# Patient Record
Sex: Female | Born: 1960 | Race: White | Hispanic: No | Marital: Married | Smoking: Never smoker
Health system: Southern US, Community
[De-identification: ages and names within clinical notes are randomized; demographics above are authoritative.]

## PROBLEM LIST (undated history)

## (undated) DIAGNOSIS — Z7901 Long term (current) use of anticoagulants: Secondary | ICD-10-CM

## (undated) DIAGNOSIS — C4491 Basal cell carcinoma of skin, unspecified: Secondary | ICD-10-CM

## (undated) DIAGNOSIS — F419 Anxiety disorder, unspecified: Secondary | ICD-10-CM

## (undated) DIAGNOSIS — Q742 Other congenital malformations of lower limb(s), including pelvic girdle: Secondary | ICD-10-CM

## (undated) DIAGNOSIS — R635 Abnormal weight gain: Secondary | ICD-10-CM

## (undated) DIAGNOSIS — R599 Enlarged lymph nodes, unspecified: Secondary | ICD-10-CM

## (undated) DIAGNOSIS — R0602 Shortness of breath: Secondary | ICD-10-CM

## (undated) DIAGNOSIS — I8390 Asymptomatic varicose veins of unspecified lower extremity: Secondary | ICD-10-CM

## (undated) DIAGNOSIS — K589 Irritable bowel syndrome without diarrhea: Secondary | ICD-10-CM

## (undated) DIAGNOSIS — G629 Polyneuropathy, unspecified: Secondary | ICD-10-CM

## (undated) DIAGNOSIS — Z5181 Encounter for therapeutic drug level monitoring: Secondary | ICD-10-CM

## (undated) DIAGNOSIS — Z8711 Personal history of peptic ulcer disease: Secondary | ICD-10-CM

## (undated) DIAGNOSIS — E669 Obesity, unspecified: Secondary | ICD-10-CM

## (undated) DIAGNOSIS — R5383 Other fatigue: Secondary | ICD-10-CM

## (undated) DIAGNOSIS — D369 Benign neoplasm, unspecified site: Secondary | ICD-10-CM

## (undated) DIAGNOSIS — R1900 Intra-abdominal and pelvic swelling, mass and lump, unspecified site: Secondary | ICD-10-CM

## (undated) DIAGNOSIS — I8000 Phlebitis and thrombophlebitis of superficial vessels of unspecified lower extremity: Secondary | ICD-10-CM

## (undated) DIAGNOSIS — H3320 Serous retinal detachment, unspecified eye: Secondary | ICD-10-CM

## (undated) DIAGNOSIS — J069 Acute upper respiratory infection, unspecified: Secondary | ICD-10-CM

## (undated) DIAGNOSIS — I82409 Acute embolism and thrombosis of unspecified deep veins of unspecified lower extremity: Secondary | ICD-10-CM

## (undated) DIAGNOSIS — S86011A Strain of right Achilles tendon, initial encounter: Secondary | ICD-10-CM

## (undated) DIAGNOSIS — IMO0002 Reserved for concepts with insufficient information to code with codable children: Secondary | ICD-10-CM

## (undated) DIAGNOSIS — R7303 Prediabetes: Secondary | ICD-10-CM

## (undated) DIAGNOSIS — Z6841 Body Mass Index (BMI) 40.0 and over, adult: Secondary | ICD-10-CM

## (undated) HISTORY — DX: Anxiety disorder, unspecified: F41.9

## (undated) HISTORY — DX: Benign neoplasm, unspecified site: D36.9

## (undated) HISTORY — DX: Encounter for therapeutic drug level monitoring: Z51.81

## (undated) HISTORY — DX: Abnormal weight gain: R63.5

## (undated) HISTORY — DX: Serous retinal detachment, unspecified eye: H33.20

## (undated) HISTORY — DX: Body Mass Index (BMI) 40.0 and over, adult: Z684

## (undated) HISTORY — PX: PRE-MALIGNANT / BENIGN SKIN LESION EXCISION: SHX160

## (undated) HISTORY — DX: Enlarged lymph nodes, unspecified: R59.9

## (undated) HISTORY — DX: Reserved for concepts with insufficient information to code with codable children: IMO0002

## (undated) HISTORY — DX: Basal cell carcinoma of skin, unspecified: C44.91

## (undated) HISTORY — DX: Acute upper respiratory infection, unspecified: J06.9

## (undated) HISTORY — DX: Obesity, unspecified: E66.9

## (undated) HISTORY — DX: Acute embolism and thrombosis of unspecified deep veins of unspecified lower extremity: I82.409

## (undated) HISTORY — DX: Morbid (severe) obesity due to excess calories: E66.01

## (undated) HISTORY — DX: Strain of right Achilles tendon, initial encounter: S86.011A

## (undated) HISTORY — DX: Shortness of breath: R06.02

## (undated) HISTORY — DX: Irritable bowel syndrome, unspecified: K58.9

## (undated) HISTORY — DX: Other congenital malformations of lower limb(s), including pelvic girdle: Q74.2

## (undated) HISTORY — DX: Long term (current) use of anticoagulants: Z79.01

## (undated) HISTORY — DX: Intra-abdominal and pelvic swelling, mass and lump, unspecified site: R19.00

## (undated) HISTORY — DX: Other fatigue: R53.83

## (undated) HISTORY — DX: Polyneuropathy, unspecified: G62.9

## (undated) HISTORY — DX: Phlebitis and thrombophlebitis of superficial vessels of unspecified lower extremity: I80.00

## (undated) HISTORY — DX: Asymptomatic varicose veins of unspecified lower extremity: I83.90

## (undated) HISTORY — DX: Personal history of peptic ulcer disease: Z87.11

## (undated) HISTORY — DX: Prediabetes: R73.03

## (undated) HISTORY — PX: VARICOSE VEIN SURGERY: SHX832

## (undated) HISTORY — PX: OTHER SURGICAL HISTORY: SHX169

---

## 1996-02-27 ENCOUNTER — Encounter: Payer: Self-pay | Admitting: Family Medicine

## 1996-02-27 LAB — CONVERTED CEMR LAB: Pap Smear: NORMAL

## 2000-11-02 ENCOUNTER — Other Ambulatory Visit: Admission: RE | Admit: 2000-11-02 | Discharge: 2000-11-02 | Payer: Self-pay | Admitting: Urology

## 2001-01-27 HISTORY — PX: FLEXIBLE SIGMOIDOSCOPY: SHX1649

## 2004-08-30 ENCOUNTER — Ambulatory Visit: Payer: Self-pay | Admitting: Family Medicine

## 2004-09-03 ENCOUNTER — Ambulatory Visit: Payer: Self-pay | Admitting: Family Medicine

## 2004-09-17 ENCOUNTER — Ambulatory Visit: Payer: Self-pay | Admitting: Family Medicine

## 2004-09-24 ENCOUNTER — Ambulatory Visit: Payer: Self-pay

## 2004-10-27 ENCOUNTER — Ambulatory Visit: Payer: Self-pay

## 2004-11-27 ENCOUNTER — Ambulatory Visit: Payer: Self-pay

## 2004-12-27 ENCOUNTER — Ambulatory Visit: Payer: Self-pay

## 2005-02-18 ENCOUNTER — Ambulatory Visit: Payer: Self-pay | Admitting: Family Medicine

## 2005-08-14 ENCOUNTER — Ambulatory Visit: Payer: Self-pay | Admitting: Family Medicine

## 2005-08-28 ENCOUNTER — Ambulatory Visit: Payer: Self-pay | Admitting: Family Medicine

## 2005-12-04 ENCOUNTER — Ambulatory Visit: Payer: Self-pay | Admitting: Family Medicine

## 2006-11-06 ENCOUNTER — Telehealth (INDEPENDENT_AMBULATORY_CARE_PROVIDER_SITE_OTHER): Payer: Self-pay | Admitting: Internal Medicine

## 2006-11-09 ENCOUNTER — Ambulatory Visit: Payer: Self-pay | Admitting: Family Medicine

## 2006-11-30 ENCOUNTER — Ambulatory Visit: Payer: Self-pay | Admitting: Family Medicine

## 2007-05-25 ENCOUNTER — Ambulatory Visit: Payer: Self-pay | Admitting: Family Medicine

## 2007-06-11 ENCOUNTER — Ambulatory Visit: Payer: Self-pay | Admitting: Family Medicine

## 2007-06-11 DIAGNOSIS — R635 Abnormal weight gain: Secondary | ICD-10-CM | POA: Insufficient documentation

## 2007-07-19 ENCOUNTER — Ambulatory Visit: Payer: Self-pay | Admitting: Family Medicine

## 2007-07-21 ENCOUNTER — Encounter: Payer: Self-pay | Admitting: Family Medicine

## 2007-07-21 DIAGNOSIS — K589 Irritable bowel syndrome without diarrhea: Secondary | ICD-10-CM | POA: Insufficient documentation

## 2007-07-21 DIAGNOSIS — I8390 Asymptomatic varicose veins of unspecified lower extremity: Secondary | ICD-10-CM | POA: Insufficient documentation

## 2007-07-21 DIAGNOSIS — I8000 Phlebitis and thrombophlebitis of superficial vessels of unspecified lower extremity: Secondary | ICD-10-CM | POA: Insufficient documentation

## 2007-10-07 ENCOUNTER — Ambulatory Visit: Payer: Self-pay | Admitting: Cardiology

## 2007-11-01 ENCOUNTER — Telehealth: Payer: Self-pay | Admitting: Family Medicine

## 2007-11-25 DIAGNOSIS — R1900 Intra-abdominal and pelvic swelling, mass and lump, unspecified site: Secondary | ICD-10-CM | POA: Insufficient documentation

## 2007-11-26 ENCOUNTER — Telehealth: Payer: Self-pay | Admitting: Family Medicine

## 2007-12-16 ENCOUNTER — Ambulatory Visit: Payer: Self-pay | Admitting: Family Medicine

## 2007-12-16 DIAGNOSIS — J069 Acute upper respiratory infection, unspecified: Secondary | ICD-10-CM | POA: Insufficient documentation

## 2007-12-16 DIAGNOSIS — R599 Enlarged lymph nodes, unspecified: Secondary | ICD-10-CM | POA: Insufficient documentation

## 2007-12-16 LAB — CONVERTED CEMR LAB
HDL goal, serum: 40 mg/dL
LDL Goal: 160 mg/dL

## 2007-12-24 ENCOUNTER — Telehealth (INDEPENDENT_AMBULATORY_CARE_PROVIDER_SITE_OTHER): Payer: Self-pay | Admitting: *Deleted

## 2007-12-28 HISTORY — PX: TOTAL ABDOMINAL HYSTERECTOMY W/ BILATERAL SALPINGOOPHORECTOMY: SHX83

## 2008-05-26 ENCOUNTER — Telehealth: Payer: Self-pay | Admitting: Family Medicine

## 2008-06-26 ENCOUNTER — Encounter: Payer: Self-pay | Admitting: Family Medicine

## 2008-06-26 ENCOUNTER — Ambulatory Visit: Payer: Self-pay | Admitting: Family Medicine

## 2008-06-27 ENCOUNTER — Ambulatory Visit: Payer: Self-pay | Admitting: Family Medicine

## 2008-06-27 HISTORY — PX: CATARACT EXTRACTION, BILATERAL: SHX1313

## 2009-03-01 ENCOUNTER — Telehealth: Payer: Self-pay | Admitting: Family Medicine

## 2009-08-27 DIAGNOSIS — S86011A Strain of right Achilles tendon, initial encounter: Secondary | ICD-10-CM

## 2009-08-27 HISTORY — DX: Strain of right Achilles tendon, initial encounter: S86.011A

## 2009-09-03 ENCOUNTER — Encounter (INDEPENDENT_AMBULATORY_CARE_PROVIDER_SITE_OTHER): Payer: Self-pay | Admitting: *Deleted

## 2009-09-24 ENCOUNTER — Encounter: Payer: Self-pay | Admitting: Family Medicine

## 2009-09-27 ENCOUNTER — Emergency Department (HOSPITAL_COMMUNITY): Admission: EM | Admit: 2009-09-27 | Discharge: 2009-09-27 | Payer: Self-pay | Admitting: Emergency Medicine

## 2009-09-27 ENCOUNTER — Encounter (INDEPENDENT_AMBULATORY_CARE_PROVIDER_SITE_OTHER): Payer: Self-pay | Admitting: Orthopedic Surgery

## 2009-09-27 ENCOUNTER — Ambulatory Visit: Payer: Self-pay | Admitting: Vascular Surgery

## 2009-09-28 ENCOUNTER — Telehealth: Payer: Self-pay | Admitting: Family Medicine

## 2009-10-02 ENCOUNTER — Ambulatory Visit: Payer: Self-pay | Admitting: Family Medicine

## 2009-10-02 LAB — CONVERTED CEMR LAB: Prothrombin Time: 16.5 s

## 2009-10-05 ENCOUNTER — Ambulatory Visit: Payer: Self-pay | Admitting: Family Medicine

## 2009-10-05 LAB — CONVERTED CEMR LAB: Prothrombin Time: 23.7 s

## 2009-10-12 ENCOUNTER — Ambulatory Visit: Payer: Self-pay | Admitting: Family Medicine

## 2009-10-26 ENCOUNTER — Ambulatory Visit: Payer: Self-pay | Admitting: Family Medicine

## 2009-10-26 LAB — CONVERTED CEMR LAB: Prothrombin Time: 37.6 s

## 2009-10-30 ENCOUNTER — Encounter: Payer: Self-pay | Admitting: Family Medicine

## 2009-11-09 ENCOUNTER — Telehealth: Payer: Self-pay | Admitting: Family Medicine

## 2009-11-09 ENCOUNTER — Ambulatory Visit: Payer: Self-pay | Admitting: Family Medicine

## 2009-11-09 LAB — CONVERTED CEMR LAB
INR: 3.2
Prothrombin Time: 38.4 s

## 2009-11-12 ENCOUNTER — Telehealth: Payer: Self-pay | Admitting: Family Medicine

## 2009-12-04 ENCOUNTER — Encounter: Payer: Self-pay | Admitting: Family Medicine

## 2009-12-07 ENCOUNTER — Ambulatory Visit: Payer: Self-pay | Admitting: Family Medicine

## 2009-12-07 LAB — CONVERTED CEMR LAB: Prothrombin Time: 38.1 s

## 2009-12-28 ENCOUNTER — Ambulatory Visit: Payer: Self-pay | Admitting: Family Medicine

## 2009-12-28 LAB — CONVERTED CEMR LAB: INR: 3

## 2010-01-23 ENCOUNTER — Ambulatory Visit
Admission: RE | Admit: 2010-01-23 | Discharge: 2010-01-23 | Payer: Self-pay | Source: Home / Self Care | Attending: Family Medicine | Admitting: Family Medicine

## 2010-01-23 DIAGNOSIS — IMO0002 Reserved for concepts with insufficient information to code with codable children: Secondary | ICD-10-CM | POA: Insufficient documentation

## 2010-01-23 LAB — CONVERTED CEMR LAB
INR: 2.8
Prothrombin Time: 34 s

## 2010-02-07 ENCOUNTER — Ambulatory Visit
Admission: RE | Admit: 2010-02-07 | Discharge: 2010-02-07 | Payer: Self-pay | Source: Home / Self Care | Attending: Family Medicine | Admitting: Family Medicine

## 2010-02-26 NOTE — Assessment & Plan Note (Signed)
Summary: FOLLOW UP / LFW   Vital Signs:  Patient profile:   50 year old female Height:      67 inches Weight:      284.75 pounds BMI:     44.76 Temp:     97.7 degrees F oral Pulse rate:   80 / minute Pulse rhythm:   regular BP sitting:   122 / 80  (left arm) Cuff size:   large  Vitals Entered By: Delilah Shan CMA [name]) (December 28, 2009 4:01 PM) CC: 3 months follow up   History of Present Illness: Still with some soreness and stiffness in leg, esp after sitting.  Went through physical therapy and was released by ortho.   Had anticoagulation for 3 months.  Wears compression stockings during flights.  Off ASA since being on coumadin.  No other h/o DVT.  Had seen Dr. Raechel Chute with vein clinic.  No h/o miscarriages.    On premarin for 1.5 years.  Had TAH and BSO.  See plan.  Sees [facility] for HRT.   Mother with h/o DVT.  Father with h/o DVT and PE after injury.      Allergies: 1)  ! 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    Review of Systems       See HPI.  Otherwise negative.    Physical Exam  General:  NAD R leg w/o edema, normal range of motion at ankle, no bands, no cords   Impression & Recommendations:  Problem # 1:  DVT-09/2009 (ICD-453.40) >25 min spent with patient, at least half of which was spent on counseling ZO:XWRU.  I would like her to come off HRT to lower risk of another DVT.  this was her first episode and it was after an injury, but there is a family history.  I would like her to talk with [facility] about taper from HRT.  She agrees.  Continue with coumadin for now, up to 3 months for now for 6 months of anticoagulation.  She could stop sooner if off HRT. She agrees with plan. If second clot, then she would likely need lifelong anticoagulation. She understands.    Complete Medication List: 1)  Premarin 0.625 Mg  Tabs (Estrogens conjugated) .... Take 1 tablet by mouth once a day 2)  Stool Softener 100 Mg Caps (Docusate sodium) .... Take 1 capsule by mouth once a day 3)  Coumadin 5 Mg Tabs (Warfarin sodium) .... Take 1-1/2  tablets  by mouth once a day  Patient Instructions: 1)  Don't change your coumadin and come back for an INR in 4 weeks.  Talk to your ObGyn clinic about coming off the premarin.  Let me know what they have to say.  We should try to get you off the coumadin in the next 3 months.  Don't take aspirin in the meantime.  Take care.    Orders Added: 1)  Est. Patient Level IV [04540]    Current Allergies (reviewed today): ! * TAPE

## 2010-02-26 NOTE — Letter (Signed)
Summary: Unable to Contact Patient/[facility]  Unable to Contact Patient/[facility]   Imported By: Lanelle Bal 12/14/2009 14:07:55  _____________________________________________________________________  External Attachment:    Type:   Image     Comment:   External Document  Appended Document: Unable to Contact Patient/[facility] call patient and leave instructions for them to make appointment.   Appended Document: Unable to Contact Patient/[facility] Left message on voicemail of cell phone to contact the [facility] or [place] if she is still committed to this program.

## 2010-02-26 NOTE — Letter (Signed)
Summary: [facility]  [facility]   Imported By: Lanelle Bal 09/28/2009 14:07:14  _____________________________________________________________________  External Attachment:    Type:   Image     Comment:   External Document  Appended Document: [facility]    Clinical Lists Changes  Observations: Added new observation of PAST MED HX: possible R achilles tear 08/2009 per [facility] ortho (09/28/2009 16:13)       Past History:  Past Medical History: possible R achilles tear 08/2009 per [facility] ortho

## 2010-02-26 NOTE — Assessment & Plan Note (Signed)
Summary: F/U per [facility] Nurse Line - PT/INR Joann Mendez   Vital Signs:  Patient profile:   50 year old female Height:      67 inches Weight:      264.12 pounds BMI:     41.52 Temp:     98.2 degrees F oral Pulse rate:   76 / minute Pulse rhythm:   regular BP sitting:   110 / 80  (left arm) Cuff size:   large  Vitals Entered By: Delilah Shan CMA Rupinder Livingston Dull) (October 02, 2009 9:11 AM) CC: F/U per Dr. Para March - Nurse Line  -  Will get PT/INR after the visit., Back Pain   History of Present Illness: Possible achilles tear in [place] last week in August.  Saw [facility] ortho and in [facility] walker/boot.  Called back on Thursday bc patient was having calf pain.  H/o DVT in the familiy.  Pt seen at ER and DVT seen.  Started on coumadin since 09/27/09.  Leg feels better with less pain.  Has follow up with ortho re: MRI today.  Due for INR check.  Records reviewed.  Not SOB.  No CP.   This is first DVT for patient.  On lovenox.    Allergies: 1)  ! * Tape  Past History:  Family History: Last updated: 10/02/2009 Father: ALIVE AT 38 YOA +HTN Mother: ALIVE 67 YOA ? ATRIAL FIB, h/o DVT Siblings: 1 SISTER ALIVE AT 30 YOA CV: NEGATIVE HBP: + FATHER DM: NEGATIVE GOUT/ARTHRITIS: PROSTATE CANCER:  BREAST/OVARIAN/UTERINE CANCER: NEGATIVE COLON CANCER: +STOMACH GM DEPRESSION: NEGATIVE ETOH/DRUG ABUSE: NEGATIVE OTHER: NEGATIVE STROKE  Social History: Last updated: 07/21/2007 non smoker Marital Status: Married REMARRIED SUMMER OF 1997 Children: 2 DAUGHTERS Occupation: [employer], [employer]. COMPLI[employer] OFFICE  Past Medical History: possible R achilles tear 08/2009 per [facility] ortho R leg DVT 09/27/09  Family History: Father: ALIVE AT 83 YOA +HTN Mother: ALIVE 70 YOA ? ATRIAL FIB, h/o DVT Siblings: 1 SISTER ALIVE AT 30 YOA CV: NEGATIVE HBP: + FATHER DM: NEGATIVE GOUT/ARTHRITIS: PROSTATE CANCER:  BREAST/OVARIAN/UTERINE CANCER: NEGATIVE COLON CANCER: +STOMACH GM DEPRESSION: NEGATIVE ETOH/DRUG ABUSE: NEGATIVE OTHER:  NEGATIVE STROKE  Review of Systems       See HPI.  Otherwise negative.    Physical Exam  General:  GEN: nad, alert and oriented HEENT: mucous membranes moist CV: rrr.  PULM: ctab, no inc wob ABD: soft, +bs EXT: no edema, R foot in CAM walker, minimally tender to palpation on calf and at achilles- improved per patient.  not tested with weightbearing.  distally NV intact.  SKIN: no acute rash but bruising noted on abdominal wall at injection sites.    Impression & Recommendations:  Problem # 1:  DVT-09/2009 (ICD-453.40) DVT d/w patient.  Cont lovenox for now and increase coumadin in meantime.  Fu Friday for INR.  If INR<2, will need lovenox through the weekend.  Has follow up with ortho. Pt should be on coumadin for at least 3 months.  We can talk about options (3vs 6 months in 12/11).  If any SOB/CP, to ER.  She understands.  Routine coumadin instructions given.  Orders: Protime (55732KG)  Complete Medication List: 1)  Premarin 0.625 Mg Tabs (Estrogens conjugated) .... Take 1 tablet by mouth once a day 2)  Stool Softener 100 Mg Caps (Docusate sodium) .... Take 1 capsule by mouth once a day 3)  Coumadin 5 Mg Tabs (Warfarin sodium) .... Take 1 tablet by mouth once a day 4)  Lovenox 100 Mg/ml Soln (Enoxaparin sodium) .Marland KitchenMarland KitchenMarland Kitchen  1ml injected [place] two times a day  Patient Instructions: 1)  Return for your INR check Friday. 2)  I want you to come back for a visit with me in 3 months to talk about coming off the coumadin.  3)  Coumadin dose: Take 1.5 tabs on Tuesday and Wednesday and 1 tab on Thursday.  Prescriptions: LOVENOX 100 MG/ML SOLN (ENOXAPARIN SODIUM) 1ml injected [place] two times a day  #10 syringes x 0   Entered and Authorized by:   Crawford Givens MD   Signed by:   Crawford Givens MD on 10/02/2009   Method used:   [facility]        [facility]  [place]       [address]       [place], [place]  [address]       Ph: [phone] or [phone]       Fax:  [phone]   RxID:   [id number]   Current Allergies (reviewed today): ! * TAPE

## 2010-02-26 NOTE — Progress Notes (Signed)
Summary: wants [facility] to [facility]  Phone Note Call from Patient Call back at Work Phone [phone]   Caller: Patient Call For: Dr. Para March Summary of Call: Pt would like referral to [facility] at [facility].  She wants to lose some weight, but cant exercise there without a referral. Initial call taken by: Lowella Petties CMA,  November 12, 2009 11:48 AM  Follow-up for Phone Call        referral ordered.  Follow-up by: Crawford Givens MD,  November 12, 2009 1:32 PM

## 2010-02-26 NOTE — Letter (Signed)
Summary: Nadara Eaton letter  [place] [facility]  [address] [place], [place] [address]   Phone: [phone]  Fax: [phone]       09/03/2009 MRN: [id number]  Margeret Hoxworth [address] [place], [place]  [address]  Dear Ms. Dacanay,  [facility] [place] and [place] announce the retirement of Arta Silence, M.D., from full-time practice at the [facility] office effective July 26, 2009 and his plans of returning part-time.  It is important to Dr. Hetty Ely and to our practice that you understand that [facility] has seven physicians in our office for your health care needs.  We will continue to offer the same exceptional care that you have today.    Dr. Hetty Ely has spoken to many of you about his plans for retirement and returning part-time in the fall.   We will continue to work with you through the transition to schedule appointments for you in the office and meet the high standards that [facility] is committed to.   Again, it is with great pleasure that we share the news that Dr. Hetty Ely will return to [facility] in October of 2011 with a reduced schedule.    If you have any questions, or would like to request an appointment with one of our physicians, please call us at [phone] and press the option for Scheduling an appointment.  We take pleasure in providing you with excellent patient care and look forward to seeing you at your next office visit.  Our [facility] Physicians are:  Tillman Abide, M.D. Laurita Quint, M.D. Roxy Manns, M.D. Kerby Nora, M.D. Hannah Beat, M.D. Ruthe Mannan, M.D. We proudly welcomed Raechel Ache, M.D. and Eustaquio Boyden, M.D. to the practice in July/August 2011.  Sincerely,  [facility]

## 2010-02-26 NOTE — Progress Notes (Signed)
  Phone Note Call from Patient   Caller: Patient Call For: duncan Summary of Call: What should patient do when she travels because of DVT, Prior to this DVT she took ASA 1 week prior to traveling but she wasn't on Coumadin at that time. She also wears compression stockings when traveling.Please advise. Initial call taken by: Mills Koller,  November 09, 2009 12:28 PM  Follow-up for Phone Call        I wouldn't add on aspirin now.  Continue coumadin as directed and wear compression stockings if this is more comfortable for patient.  Follow-up by: Crawford Givens MD,  November 09, 2009 1:40 PM  Additional Follow-up for Phone Call Additional follow up Details #1::        Left message on home machine to return my call. Kim Dance CMA [name])  November 09, 2009 2:27 PM   Patient notified as instructed by telephone. Pt had additional question for Dr. Para March. Is it OK to travel by airlines on long distance trips if she wears compression hose? pt said a call back on Mon at work # [phone] would be appreciated.Lewanda Rife LPN  November 09, 2009 5:15 PM      Appended Document:  It is okay to travel with the compression hose.  Okay to call back Monday.   Appended Document:  Left message on machine for patient to call back.    Appended Document:  Left message on voicemail  to return call.   Appended Document:  Left message on voicemail  in detail.  Personalized VM on cell phone number.

## 2010-02-26 NOTE — Letter (Signed)
Summary: [facility]   Imported By: Sherian Rein 11/08/2009 08:22:29  _____________________________________________________________________  External Attachment:    Type:   Image     Comment:   External Document

## 2010-02-26 NOTE — Letter (Signed)
Summary: [facility]   Imported By: Sherian Rein 10/10/2009 08:05:40  _____________________________________________________________________  External Attachment:    Type:   Image     Comment:   External Document  Appended Document: [facility]    Clinical Lists Changes  Observations: Added new observation of PAST MED HX: R achilles strain 08/2009 per [facility] ortho R leg DVT 09/27/09 (10/10/2009 11:54)       Past History:  Past Medical History: R achilles strain 08/2009 per [facility] ortho R leg DVT 09/27/09

## 2010-02-26 NOTE — Progress Notes (Signed)
Summary: tamiflu  Phone Note Call from Patient Call back at Home Phone [phone]   Caller: Patient Call For: Shaune Leeks MD Summary of Call: Patient wants to know if she can get a rx for tamiflu called [facility] [place] because she is taking care of husband and daughter who both have the flu and she is afraid that she will get it.  Initial call taken by: Melody Comas,  March 01, 2009 10:47 AM    Prescriptions: TAMIFLU 75 MG CAPS (OSELTAMIVIR PHOSPHATE) Take 1 tablet by mouth once a day x 10 days  #10 x 0   Entered and Authorized by:   Shaune Leeks MD   Signed by:   Shaune Leeks MD on 03/01/2009   Method used:   [facility]ically [facility]        [facility]  [place] (retail)       [address]       [place], [place]  [address]       Ph: [phone] or [phone]       Fax: [phone]   RxID:   [id number]

## 2010-02-26 NOTE — Progress Notes (Signed)
Summary: fax from call a nurse   Phone Note Call from Patient Call back at [phone]   Caller: Fax from call a nurse- [place] Call For: Dr. Para March  Summary of Call: Message from call a nurse- new dvt, started on Lovenox and coumadin and will need to recheck on Tuesday, 9-6, Dr. Anitra Lauth is the dr. at [place].  Initial call taken by: Melody Comas,  September 28, 2009 8:51 AM  Follow-up for Phone Call        call patient and make sure she has OV on Tuesday.  We can do INR at that time.  Follow-up by: Crawford Givens MD,  September 28, 2009 9:41 AM  Additional Follow-up for Phone Call Additional follow up Details #1::        She only has a lab appt on Tuesday, 10/02/09.  I will phone her to make an OV also.  Delilah Shan CMA [name])  September 28, 2009 11:54 AM   New Problems: DVT-09/2009 (ICD-453.40)   New Problems: DVT-09/2009 (ICD-453.40)

## 2010-02-28 NOTE — Assessment & Plan Note (Signed)
Summary: ? bronchitis/nt   Vital Signs:  Patient profile:   50 year old female Height:      67 inches Weight:      282 pounds BMI:     44.33 Temp:     97.9 degrees F oral Pulse rate:   84 / minute Pulse rhythm:   regular BP sitting:   134 / 80  (left arm) Cuff size:   large  Vitals Entered By: Delilah Shan CMA [name]) (January 23, 2010 11:50 AM) CC: ? bronchitis.  ? Protime today?   History of Present Illness: Pt here for thinking she has bronchitis. She travels everywhere with her job...she has been off for the last week and got sick.  She went to [facility] for Christmas with her family and woke up with a red eye. She had eyewash from after surgery a while back and used that. It was Prednisolone. It helped. Then she developed cough and ST and congestion. She took part of a Zpak that her family had. She then started feeling better after using the two days of Zithromax, but that was all she had. She has had some wheezing, some productive cough. No fever or chills, no headache but had one two days ago. She has taken some excedrin....she is on Coumadin.  She has had multiple problems in the last year or so to include multiple tendon and muscle tears, DVT hysterectomy and hot flashes.  She also would like me to look at her little toe on the left foot where she frequently gets maceration after blister formation on the underside of the toe which forms white discharge and recurs. She typically opens the blister and cleans it out, then uses betadine and neosporin. But again this tends to recur. She also, after discussing things with her mother, was told she needs to take a small dose of Valium "to take the edge off" in that she is trying to juggle fourteen things at one time. She alson wanted to discuss her husband's situation and their need to get on their own, away from the family horse [place].  Problems Prior to Update: 1)  Encounter For Therapeutic Drug Monitoring  (ICD-V58.83) 2)  Encounter For  Long-term Use of Anticoagulants  (ICD-V58.61) 3)  Dvt-09/2009  (ICD-453.40) 4)  Enlargement of Lymph Nodes  (ICD-785.6) 5)  Uri  (ICD-465.9) 6)  Abdominal Mass  (ICD-789.30) 7)  Varicose Veins, Lower Extremities  (ICD-454.9) 8)  Superficial Thrombophlebitis  (ICD-451.0) 9)  Irritable Bowel Syndrome  (ICD-564.1) 10)  Weight Gain  (ICD-783.1)  Medications Prior to Update: 1)  Premarin 0.625 Mg Tabs (Estrogens Conjugated) .... Take 1 Tablet By Mouth Once A Day 2)  Stool Softener 100 Mg Caps (Docusate Sodium) .... Take 1 Capsule By Mouth Once A Day 3)  Coumadin 5 Mg Tabs (Warfarin Sodium) .... Take 1-1/2  Tablets  By Mouth Once A Day  Allergies: 1)  ! * Tape  Physical Exam  General:  NAD, overweight, minimally congested.  Head:  Normocephalic and atraumatic without obvious abnormalities. No apparent alopecia or balding. Sinuses NT. Eyes:  Conjunctiva clear bilaterally. Palp conjunctiva quiet. Ears:  External ear exam shows no significant lesions or deformities.  Otoscopic examination reveals clear canals, tympanic membranes are intact bilaterally without bulging, retraction, inflammation or discharge. Hearing is grossly normal bilaterally. Nose:  External nasal examination shows no deformity or inflammation. Nasal mucosa are pink and moist without lesions or exudates. L nare mildly narrowed. Mouth:  Oral mucosa and oropharynx without lesions  or exudates.  Teeth in good repair. Neck:  No deformities, masses, or tenderness noted. Lungs:  Normal respiratory effort, chest expands symmetrically. Lungs are clear to auscultation, no crackles or wheezes. Heart:  Normal rate and regular rhythm. S1 and S2 normal without gallop, murmur, click, rub or other extra sounds. Extremities:  R leg w/o edema, normal range of motion at ankle, no bands, no cords L little toe with abrasive area with two inflammatory blisters with white nonpustular discharge in one, no erythema, streaking or swelling. Psych:   Cognition and judgment appear intact. Alert and cooperative with normal attention span and concentration. No apparent delusions, illusions, hallucinations   Impression & Recommendations:  Problem # 1:  BRONCHITIS- ACUTE (ICD-466.0) Assessment New See instructions. Her updated medication list for this problem includes:    Zithromax Z-pak 250 Mg Tabs (Azithromycin) .Marland Kitchen... As dir  Problem # 2:  ENCOUNTER FOR LONG-TERM USE OF ANTICOAGULANTS (ICD-V58.61) Assessment: Unchanged  Orders:Due to exposure to Abs, check PT today. Protime (95621HY)  Problem # 3:  CONJUNCTIVITIS (ICD-372.30) Assessment: New  Presumably viral as is clearing on its own....discussed using hypotears as needed. Throw away old eyedrops.  Discussed treatment, and urged patient to wash hands carefully after touching face.   Problem # 4:  EARLY HAMMER TOE, LITTLE LEFT TOE (ICD-755.66) Assessment: New Discussed attempts at straightening toe to avoid abrasion and resultant blistering.  Problem # 5:  AGITATION (ICD-307.9) Assessment: New Discussed at length. I am not in favor of using "Tranquilizers" to take thge edge off. She really needs toi cut back.  Complete Medication List: 1)  Premarin 0.625 Mg Tabs (Estrogens conjugated) .... Take 1 tablet by mouth once a day 2)  Stool Softener 100 Mg Caps (Docusate sodium) .... Take 1 capsule by mouth once a day 3)  Coumadin 5 Mg Tabs (Warfarin sodium) .... Take 1-1/2  tablets  by mouth once a day 4)  Zithromax Z-pak 250 Mg Tabs (Azithromycin) .... As dir  Patient Instructions: 1)  Take a Z pak to finish. 2)  Take Guaifenesin by [facility] or [facility] and getting MUCOUS RELIEF EXPECTORANT (400mg ), take 11/2 tabs by mouth AM and NOON. 3)  Drink lots of fluids anytime taking Guaifenesin.  4)  Check PT today. 5)  50 mins spent with pt. Prescriptions: ZITHROMAX Z-PAK 250 MG TABS (AZITHROMYCIN) as dir  #1 pak x 0   Entered and Authorized by:   Shaune Leeks MD   Signed by:   Shaune Leeks MD on 01/23/2010   Method used:   [facility]        [facility]  [place]       [address]       [place], [place]  [address]       Ph: [phone] or [phone]       Fax: [phone]   RxID:   [id number]    Orders Added: 1)  Protime [[id number]] 2)  Est. Patient Level IV [84166]    Current Allergies (reviewed today): ! * TAPE  Laboratory Results   Blood Tests   Date/Time Recieved: January 23, 2010 12:35 PM  Date/Time Reported: January 23, 2010 12:35 PM   PT: 34.0 s   (Normal Range: 10.6-13.4)  INR: 2.8   (Normal Range: 0.88-1.12   Therap INR: 2.0-3.5)      ANTICOAGULATION RECORD PREVIOUS REGIMEN & LAB RESULTS Anticoagulation Diagnosis:  Deep venous thrombosis on  10/02/2009 Previous INR Goal Range:  2.0-3.0 on  10/02/2009 Previous INR:  3.0 on  12/28/2009 Previous Coumadin Dose(mg):  7.5mg  daily on  10/12/2009 Previous Regimen:  7.5,g daily, 5mg  tues, fri on  10/12/2009 Previous Coagulation Comments:  . on  10/26/2009  NEW REGIMEN & LAB RESULTS Current INR: 2.8 Current Coumadin Dose(mg): 7.5,g daily, 5mg  tues, fri Regimen: 7.5,g daily, 5mg  tues, fri  (no change)  Provider: schaller      Repeat testing in: next week has appt already MEDICATIONS PREMARIN 0.625 MG TABS (ESTROGENS CONJUGATED) Take 1 tablet by mouth once a day STOOL SOFTENER 100 MG CAPS (DOCUSATE SODIUM) Take 1 capsule by mouth once a day COUMADIN 5 MG TABS (WARFARIN SODIUM) Take 1-1/2  tablets  by mouth once a day ZITHROMAX Z-PAK 250 MG TABS (AZITHROMYCIN) as dir  Dose has been reviewed with patient or caretaker during this visit.  Reviewed by: Allison Quarry  Anticoagulation Visit Questionnaire      Coumadin dose missed/changed:  No      Abnormal Bleeding Symptoms:  No Any diet changes including alcohol intake, vegetables or greens since the last visit:  No Any illnesses or hospitalizations since the last visit:   Yes      Recent Illness/Hospitalizations:  on z pack Any signs of clotting since the last visit (including chest discomfort, dizziness, shortness of breath, arm tingling, slurred speech, swelling or redness in leg):  No

## 2010-03-25 ENCOUNTER — Telehealth: Payer: Self-pay | Admitting: Family Medicine

## 2010-04-01 ENCOUNTER — Encounter: Payer: Self-pay | Admitting: Family Medicine

## 2010-04-04 NOTE — Progress Notes (Signed)
Summary: pt will stop coumadin and premarin tomorrow  Phone Note Call from Patient Call back at Home Phone [phone]   Caller: Patient Call For: Crawford Givens MD Complaint: Urinary/GYN Problems Summary of Call: Pt wanted you to know that tomorrow is her last day on premarin and her last day on coumadin.  She was told to wait a week beforel she started taking a low dose aspirin.  She just wanted to confirm that this is still the plan.  Please call her and let her know if this isn't correct. Initial call taken by: Lowella Petties CMA, AAMA,  March 25, 2010 4:08 PM  Follow-up for Phone Call        I called patient and LMOVM.  I'll try to get in touch with her tomorrow.  Base on prev discussions, coming off the coumadin and starting aspirin would be reasonable.  I will try to discuss with patient.    Quoted from last OV with patient (12/28/09)-I would like her to come off HRT to lower risk of another DVT.  this was her first episode and it was after an injury, but there is a family history.  I would like her to talk with [facility] about taper from HRT.  She agrees.  Continue with coumadin for now, up to 3 months for now for 6 months of anticoagulation.  She could stop sooner if off HRT. She agrees with plan. If second clot, then she would likely need lifelong anticoagulation. She understands.   Follow-up by: Crawford Givens MD,  March 25, 2010 5:17 PM     Appended Document: pt will stop coumadin and premarin tomorrow I called and LVMOMV for patient.   Appended Document: pt will stop coumadin and premarin tomorrow  She just got back in town.  She was in [place] caring for father after he had a CABG/valve replacement.  She was tapered off premarin.  We talked about the coumadin.  At this point, it is reasonable to stop the coumadin.  See the above notes.  I would start 81mg  aspirin a day.  She is aware of potential for another clot off coumadin and the potential for bleeding with  continued coumadin.   Clinical Lists Changes  Observations: Added new observation of FAMILY HX: Father: ALIVE +HTN, CAD s/p CABG and valve replacement after gallstone pancreatitis Mother: ALIVE ? ATRIAL FIB, h/o DVT Siblings: 1 SISTER ALIVE AT 30 YOA CV: NEGATIVE HBP: + FATHER DM: NEGATIVE GOUT/ARTHRITIS: PROSTATE CANCER:  BREAST/OVARIAN/UTERINE CANCER: NEGATIVE COLON CANCER: +STOMACH GM DEPRESSION: NEGATIVE ETOH/DRUG ABUSE: NEGATIVE OTHER: NEGATIVE STROKE  (03/26/2010 17:38)        Family History: Father: ALIVE +HTN, CAD s/p CABG and valve replacement after gallstone pancreatitis Mother: ALIVE ? ATRIAL FIB, h/o DVT Siblings: 1 SISTER ALIVE AT 30 YOA CV: NEGATIVE HBP: + FATHER DM: NEGATIVE GOUT/ARTHRITIS: PROSTATE CANCER:  BREAST/OVARIAN/UTERINE CANCER: NEGATIVE COLON CANCER: +STOMACH GM DEPRESSION: NEGATIVE ETOH/DRUG ABUSE: NEGATIVE OTHER: NEGATIVE STROKE

## 2010-04-11 LAB — POCT I-STAT, CHEM 8
BUN: 10 mg/dL (ref 6–23)
Chloride: 105 mEq/L (ref 96–112)
Creatinine, Ser: 0.8 mg/dL (ref 0.4–1.2)
Glucose, Bld: 117 mg/dL — ABNORMAL HIGH (ref 70–99)
HCT: 38 % (ref 36.0–46.0)
Potassium: 3.9 mEq/L (ref 3.5–5.1)

## 2010-04-16 NOTE — Letter (Signed)
Summary: [facility]   Imported By: Kassie Mends 04/08/2010 10:12:02  _____________________________________________________________________  External Attachment:    Type:   Image     Comment:   External Document

## 2010-05-17 ENCOUNTER — Ambulatory Visit (INDEPENDENT_AMBULATORY_CARE_PROVIDER_SITE_OTHER): Payer: BC Managed Care – PPO | Admitting: Family Medicine

## 2010-05-17 ENCOUNTER — Encounter: Payer: Self-pay | Admitting: Family Medicine

## 2010-05-17 VITALS — BP 140/82 | HR 84 | Temp 98.3°F | Ht 67.5 in | Wt 285.0 lb

## 2010-05-17 DIAGNOSIS — J40 Bronchitis, not specified as acute or chronic: Secondary | ICD-10-CM | POA: Insufficient documentation

## 2010-05-17 MED ORDER — AZITHROMYCIN 250 MG PO TABS: ORAL_TABLET | ORAL | Status: AC

## 2010-05-17 NOTE — Patient Instructions (Signed)
Sounds like bronchitis.  Take zpack.  Continue tussionex and mucinex/guaifenesin. Push fluids and plenty of rest. Please return if you are not improving as expected, or if you have high fevers (>101.5) or difficulty swallowing or worsening productive cough. Call clinic with questions.  Pleasure to see you today.

## 2010-05-17 NOTE — Progress Notes (Signed)
  Subjective:    Patient ID: Joann Mendez, female    DOB: January 02, 1961, 50 y.o.   MRN: [id number]  HPI CC: ?bronchitis  5d h/o feeling ill.  Started with lots of drainage, congestion and sinus pressure.  Now has cough productive of green mucous, hoarseness, raspy.  Fatigue but not as much as when had PNA in past.  Taking guaifenesin IR in am and at noon as well as tussionex.  Not cutting it.  Subjective fever 2 days ago.  No abd pain, n/v/d, rashes, body aches.  Husband recently sick as well.  H/o bronchitis and walking PNA in past.  No h/o asthma, allergies, smoking.  Review of Systems Per HPI    Objective:   Physical Exam  Nursing note and vitals reviewed. Constitutional: She appears well-developed and well-nourished.  HENT:  Head: Normocephalic and atraumatic.  Right Ear: External ear normal.  Left Ear: External ear normal.  Nose: Rhinorrhea present. No mucosal edema. Right sinus exhibits no maxillary sinus tenderness and no frontal sinus tenderness. Left sinus exhibits no maxillary sinus tenderness and no frontal sinus tenderness.  Mouth/Throat: Mucous membranes are normal. Posterior oropharyngeal erythema present. No oropharyngeal exudate or posterior oropharyngeal edema.       Mild mid forehead sinus tendenress  Eyes: Conjunctivae and EOM are normal. Pupils are equal, round, and reactive to light.  Neck: Normal range of motion. Neck supple. No thyromegaly present.  Cardiovascular: Normal rate, regular rhythm, normal heart sounds and intact distal pulses.   No murmur heard. Pulses:      Radial pulses are 2+ on the right side, and 2+ on the left side.  Pulmonary/Chest: Effort normal and breath sounds normal. No respiratory distress. She has no wheezes. She has no rales.       coughing  Lymphadenopathy:    She has no cervical adenopathy.  Skin: Skin is warm and dry. No rash noted.          Assessment & Plan:

## 2010-05-17 NOTE — Assessment & Plan Note (Signed)
Only 5 days duration but acutely congested, h/o PNA in past after bronchitis.  Given weekend, treat aggressively with zpack early. Update [place] if not better.

## 2010-06-11 NOTE — Assessment & Plan Note (Signed)
[facility]                            CARDIOLOGY OFFICE NOTE   NAME:Holtrop, TINY CHAUDHARY                         MRN:          [id number]  DATE:10/07/2007                            DOB:          [birth year]    I was asked by Dr. Laurita Quint to evaluate Thedore Mins with chest  discomfort.   HISTORY OF PRESENT ILLNESS:  The patient is a 50 year old married white  female, daughter-in-law of Eboni Coval one of my patients, who has been  having chest tightness above her left breast.  It does not radiate.  It  is not associated with any nausea, vomiting, or diaphoresis.  It comes  on usually when she is very stressed particularly at work.  It is not  exertion related.   She is very concerned about her heart because of risk factors.  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   Fortunately, she is blessed with a good lipid panel with a total  cholesterol of 167, HDL 54, LDL 97, triglycerides 78.  She is not  diabetic yet.  She has no history of thyroid disease and a recent TSH  was normal.   Her past medical history remarkable in that she has superficial vein  thrombosis of her left lower extremity after a prolonged flight.  She  did not take Coumadin.  She takes aspirin 81 mg a day.   She is on no other medications.   She is intolerant of some generic antibiotics, which she does not list.   She does not smoke.  She does not drink.  She does not eat a lot of meat  because of diverticular problem.   Family history is negative for premature coronary disease.   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  She has  worked herself up to [employer] and now is responsible for multiple  offices all over [place].  She only commits 2 hours a day with her  children who go to private [employer] school in [place].   She is married.  She lives [place] the [place] farm.   Her review of systems other than the HPI, she does have a history of the  stomach and bowel problems and has to watch her diet.  She has a history  of chronic anxiety.  She is clearly type A.  She and I discussed today.  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  Review of systems otherwise is  negative.   PHYSICAL EXAMINATION:  GENERAL:  She is very pleasant.  Alert and  oriented x3.  SKIN:  Warm and dry.  VITAL SIGNS:  Blood pressure 138/88, her pulse 71 and regular, she is 5  feet 7-1/2 inches, weighs 265 pounds.  HEENT:  Normal.  NECK:  Carotid upstrokes are equal bilaterally without bruits.  Thyroid  is not enlarged.  Trachea is midline.  Neck is supple.  LUNGS:  Clear to auscultation and percussion.  HEART:  A poorly appreciated PMI.  She has a soft S1 and S2 with no  murmur.  ABDOMEN:  Soft, good bowel sounds.  Organomegaly is difficult to assess,  but there is no midline or flank bruit.  EXTREMITIES:  No edema.  Pulses are intact.  There is no sign of DVT or  superficial thrombophlebitis.  NEURO:  Intact.   Electrocardiogram is normal except for slight rightward axis, which is  really borderline.  She has some nonspecific-ST-segment changes.   ASSESSMENT/PLAN:  1. Noncoronary or cardiac chest pain.  2. Stress-related chest discomfort.  3. Unhealthy lifestyle.  4. Obesity.   I have had a 30-minute discussion with Mrs. Kilbourne today.  I have  recommend the following;  1. Realize that her job is not the end of all means.  She clearly      needs to prioritize time for herself and a healthier lifestyle.  2. Weight Watchers with accountability to lose weight.  She is      obviously eating too much for her metabolism.  3. Three hours of walking per week.  4. Annual checkups to Dr. Hetty Ely to make sure she does not become       diabetic or hypertensive.     Thomas C. Daleen Squibb, MD, [facility]  Electronically Signed    TCW/MedQ  DD: 10/07/2007  DT: 10/08/2007  Job #: [id number]   cc:   Arta Silence, MD

## 2010-09-02 ENCOUNTER — Ambulatory Visit: Payer: BC Managed Care – PPO | Admitting: Family Medicine

## 2010-09-03 ENCOUNTER — Encounter: Payer: Self-pay | Admitting: Family Medicine

## 2010-09-03 ENCOUNTER — Ambulatory Visit (INDEPENDENT_AMBULATORY_CARE_PROVIDER_SITE_OTHER): Payer: Federal, State, Local not specified - PPO | Admitting: Family Medicine

## 2010-09-03 ENCOUNTER — Ambulatory Visit (INDEPENDENT_AMBULATORY_CARE_PROVIDER_SITE_OTHER)
Admission: RE | Admit: 2010-09-03 | Discharge: 2010-09-03 | Disposition: A | Discharge status: Home / Self Care | Payer: Federal, State, Local not specified - PPO | Source: Ambulatory Visit | Attending: Family Medicine | Admitting: Family Medicine

## 2010-09-03 DIAGNOSIS — I82409 Acute embolism and thrombosis of unspecified deep veins of unspecified lower extremity: Secondary | ICD-10-CM | POA: Insufficient documentation

## 2010-09-03 DIAGNOSIS — M79609 Pain in unspecified limb: Secondary | ICD-10-CM

## 2010-09-03 DIAGNOSIS — M5416 Radiculopathy, lumbar region: Secondary | ICD-10-CM

## 2010-09-03 DIAGNOSIS — M79605 Pain in left leg: Secondary | ICD-10-CM

## 2010-09-03 DIAGNOSIS — IMO0002 Reserved for concepts with insufficient information to code with codable children: Secondary | ICD-10-CM

## 2010-09-03 DIAGNOSIS — I8000 Phlebitis and thrombophlebitis of superficial vessels of unspecified lower extremity: Secondary | ICD-10-CM

## 2010-09-03 MED ORDER — CYCLOBENZAPRINE HCL 10 MG PO TABS: 10.0000 mg | ORAL_TABLET | Freq: Three times a day (TID) | ORAL | Status: AC | PRN

## 2010-09-03 MED ORDER — DICLOFENAC SODIUM 75 MG PO TBEC: 75.0000 mg | DELAYED_RELEASE_TABLET | Freq: Two times a day (BID) | ORAL | Status: DC

## 2010-09-03 MED ORDER — PREDNISONE 10 MG PO TABS: ORAL_TABLET | ORAL | Status: AC

## 2010-09-03 NOTE — Patient Instructions (Signed)
REFERRAL: GO THE THE FRONT ROOM AT THE ENTRANCE OF OUR CLINIC, NEAR CHECK IN. ASK FOR Joann Mendez. SHE WILL HELP YOU SET UP YOUR REFERRAL. DATE: TIME:  F/u 3-4 weeks

## 2010-09-03 NOTE — Progress Notes (Signed)
  Subjective:    Patient ID: Joann Mendez, female    DOB: 06-16-1960, 50 y.o.   MRN: [id number]  HPI  Pain starting in her hip and moving all the way to her ankle. Tingling some. Started to be a little bit achy.   3 weeks ago was on a business trip, wore a compression hose. Left leg was a little bit heavy and felt like she took a heavy step. The longer the day went, then she had difficulty in the lateral biuttock and laterally and then some pain down her leg. Yesterday, had difficulty dressing. Went to work - had a great deal of difficulty. Hurting, feels clammy. Some decreased dorsiflexion. No centralized back pain.   Yesterday, lower leg felt like some bee stings were in it.   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 Recent DVT, off coumadin now. Travel extended over the last couple of weeks for work.  The PMH, PSH, Social History, Family History, Medications, and allergies have been reviewed in [facility], and have been updated if relevant.  Review of Systems REVIEW OF SYSTEMS  GEN: No fevers, chills. Nontoxic. Primarily MSK c/o today. MSK: Detailed in the HPI GI: tolerating PO intake without difficulty Neuro: detailed above Otherwise the pertinent positives of the ROS are noted above.      Objective:   Physical Exam   Physical Exam  Blood pressure 130/88, pulse 74, temperature 97.7 F (36.5 C), temperature source Oral, height 5\' 8"  (1.727 m), weight 283 lb 1.9 oz (128.422 kg), SpO2 98.00%.  Gen: Well-developed,well-nourished,in no acute distress; alert,appropriate and cooperative throughout examination HEENT: Normocephalic and atraumatic without obvious abnormalities.  Ears, externally no deformities Pulm: Breathing comfortably in no respiratory distress Range of motion at  the waist: Flexion: mild pain Extension: mild pain Rotation: mild pain  No  echymosis or edema Rises to examination table with mild difficulty Gait: minimally antalgic  Inspection/Deformity: N Paraspinus T: n TTP L buttocks  B Ankle Dorsiflexion (L5,4): 5/5 B Great Toe Dorsiflexion (L5,4): 5/5 Heel Walk (L5): L foot drop Toe Walk (S1): WNL Rise/Squat (L4): WNL, mild pain  SENSORY B Medial Foot (L4): decreased B Dorsum (L5): decreased at webspace and more medially B Lateral (S1): WNL Light Touch: abnormal Pinprick: abnormal  B SLR, seated: neg B SLR, supine: neg B FABER: neg B Reverse FABER: neg B Greater Troch: TTP on L B Log Roll: neg B Stork: NT B Sciatic Notch: NT Leg Lengths: equal      Assessment & Plan:   1. DVT (deep venous thrombosis) - history significant for DVT and SVT. Independent risk of radiculopathy. Check u/s - high patient level f concern. Discussed and recommended hypercoag panel given her history, but the patient declined. US Venous Img Lower Unilateral Left  2. Leg pain, left  US Venous Img Lower Unilateral Left  3. Left lumbar radiculopathy - suspect L nerve impingement on the L foot drop, radiculopathy, decreased sensation. DG Lumbar Spine Complete  4. SUPERFICIAL THROMBOPHLEBITIS

## 2010-09-05 ENCOUNTER — Encounter: Payer: Self-pay | Admitting: Family Medicine

## 2010-11-22 ENCOUNTER — Encounter: Payer: Self-pay | Admitting: Family Medicine

## 2010-11-22 ENCOUNTER — Ambulatory Visit (INDEPENDENT_AMBULATORY_CARE_PROVIDER_SITE_OTHER): Payer: Federal, State, Local not specified - PPO | Admitting: Family Medicine

## 2010-11-22 VITALS — BP 142/80 | HR 80 | Temp 98.2°F | Wt 288.0 lb

## 2010-11-22 DIAGNOSIS — J40 Bronchitis, not specified as acute or chronic: Secondary | ICD-10-CM

## 2010-11-22 MED ORDER — HYDROCOD POLST-CHLORPHEN POLST 10-8 MG/5ML PO LQCR: 5.0000 mL | Freq: Every evening | ORAL | Status: DC | PRN

## 2010-11-22 MED ORDER — AZITHROMYCIN 250 MG PO TABS: ORAL_TABLET | ORAL | Status: AC

## 2010-11-22 NOTE — Progress Notes (Signed)
  Subjective:    Patient ID: Joann Mendez, female    DOB: [birth year], 50 y.o.   MRN: [id number]  HPI CC: cold sxs  1 wk ago started with cold sxs, RN clear.  Last night started having more chest congestion.  Taking dayquil and delsym, not helping.  Cough productive of yellow/green sputum.  Subjective chills last night.  Mild forehead tightness.  No fevers, abd pain, n/v/d, HA, ear pain or tooth pain.  No rashes.  No myalgia, arthralgia.  Leaving town on Monday for business trip.  tussionex has expired.  States keeps backup zpack at home.  Husband sick as well.  No smokers at home.  No h/o asthma/COPD.  States tends to get bronchitis or walking PNA.  Review of Systems Per HPI    Objective:   Physical Exam  Nursing note and vitals reviewed. Constitutional: She appears well-developed and well-nourished. No distress.       Evidently congested  HENT:  Head: Normocephalic and atraumatic.  Right Ear: External ear normal.  Left Ear: External ear normal.  Nose: No mucosal edema or rhinorrhea. Right sinus exhibits no maxillary sinus tenderness and no frontal sinus tenderness. Left sinus exhibits no maxillary sinus tenderness and no frontal sinus tenderness.  Mouth/Throat: Uvula is midline, oropharynx is clear and moist and mucous membranes are normal. No oropharyngeal exudate.  Eyes: Conjunctivae and EOM are normal. Pupils are equal, round, and reactive to light. No scleral icterus.  Neck: Normal range of motion. Neck supple.  Cardiovascular: Normal rate, regular rhythm, normal heart sounds and intact distal pulses.   No murmur heard. Pulmonary/Chest: Effort normal and breath sounds normal. No respiratory distress. She has no wheezes. She has no rales.  Musculoskeletal: She exhibits no edema.  Lymphadenopathy:    She has no cervical adenopathy.  Skin: Skin is warm and dry. No rash noted.          Assessment & Plan:

## 2010-11-22 NOTE — Patient Instructions (Signed)
Sounds like you do have bronchitis. I'll send in zpack and printed out prescription for tussionex. May use immediate release guaifenesin with plenty of fluid to mobilize mucous. Update [place] if not improving as expected, fever> 101.5, or worsening. Good to see you today, I hope you start feeling better.

## 2010-11-22 NOTE — Assessment & Plan Note (Signed)
Bronchitis. Treat with zpack, tussionex for cough. Update if not improving as expected.

## 2010-12-25 ENCOUNTER — Encounter: Payer: Self-pay | Admitting: Family Medicine

## 2010-12-25 ENCOUNTER — Ambulatory Visit (INDEPENDENT_AMBULATORY_CARE_PROVIDER_SITE_OTHER): Payer: Federal, State, Local not specified - PPO | Admitting: Family Medicine

## 2010-12-25 VITALS — BP 132/78 | HR 92 | Temp 98.5°F | Wt 284.1 lb

## 2010-12-25 DIAGNOSIS — J40 Bronchitis, not specified as acute or chronic: Secondary | ICD-10-CM

## 2010-12-25 MED ORDER — AZITHROMYCIN 250 MG PO TABS: ORAL_TABLET | ORAL | Status: AC

## 2010-12-25 NOTE — Patient Instructions (Signed)
Hold onto the rx for the antibiotics and see if you get better with rest and fluids/cough medicine in the meantime.  Start the antibiotics this weekend if not better.  If you get profoundly short of breath, let us know.

## 2010-12-25 NOTE — Assessment & Plan Note (Addendum)
She is nontoxic.  This is likely viral, especially with the voice changes/laryngitis.  At this point, I would hold abx and see if she didn't improve with supportive care.  Start abx is sx are lasting ~1 week w/o improvement.  She agrees.  Rest and fluids in meantime.  Ctab.

## 2010-12-25 NOTE — Progress Notes (Signed)
Monday night ST and since then cough, HA, discolored sputum.  No fevers.  Voice change.  Deep breath triggers a cough.  No new myalgias.  No sweats but feels cold.  Taking tussionex for cough with some relief along with guaifenesin.    ROS: See HPI.  Otherwise negative.    Meds, vitals, and allergies reviewed.   GEN: nad, alert and oriented, she looks she doesn't feel well but isn't toxic.   HEENT: mucous membranes moist, TM w/o erythema, nasal epithelium injected, OP with cobblestoning NECK: supple w/o LA CV: rrr. PULM: ctab, no inc wob, cough noted but no rales or wheeze ABD: soft, +bs EXT: no edema

## 2011-01-10 ENCOUNTER — Telehealth: Payer: Self-pay | Admitting: Internal Medicine

## 2011-01-10 NOTE — Telephone Encounter (Signed)
Patient stated she has been in for bronchitis a couple of times and she is still coughing u phelgm and fighting the infection.  She said you seen her husband yesterday and he was telling you about her and you stated for her to call.  Please advise.

## 2011-01-10 NOTE — Telephone Encounter (Signed)
Still with fatigue and cough, rhinorrhea.  Still taking tussionex at night.  She took the zithromax and and still isn't improved.  She's making some progress with the cough, but it's slow.  No fevers.  I asked her to call me next week with update.  We didn't change anything now.

## 2011-01-14 ENCOUNTER — Telehealth: Payer: Self-pay | Admitting: Internal Medicine

## 2011-01-14 MED ORDER — AMOXICILLIN-POT CLAVULANATE 875-125 MG PO TABS: 1.0000 | ORAL_TABLET | Freq: Two times a day (BID) | ORAL | Status: AC

## 2011-01-14 NOTE — Telephone Encounter (Signed)
Patient states she is tired and run down, still coughing and congested, sinus drainage.  She is going out of town for Christmas and she will be around a lot of elderly people who have had surgeries and who is on chemotherapy.  She doesn't want to be sick around them.  She did state that she was supposed to return a call and give a update.

## 2011-01-14 NOTE — Telephone Encounter (Signed)
Still with cough, sputum, rhinorrhea, no fevers. Finished zmax at end of 11/12.  Still with delsym and tussionex for cough along with cough drops. We talked about options.  Given the duration, the sputum and cough, I would broaden coverage to augmentin.  Rx sent.

## 2011-02-28 DIAGNOSIS — C4491 Basal cell carcinoma of skin, unspecified: Secondary | ICD-10-CM

## 2011-02-28 HISTORY — PX: MOHS SURGERY: SUR867

## 2011-02-28 HISTORY — DX: Basal cell carcinoma of skin, unspecified: C44.91

## 2011-08-26 ENCOUNTER — Encounter: Payer: Self-pay | Admitting: Family Medicine

## 2011-08-26 ENCOUNTER — Ambulatory Visit (INDEPENDENT_AMBULATORY_CARE_PROVIDER_SITE_OTHER): Payer: Federal, State, Local not specified - PPO | Admitting: Family Medicine

## 2011-08-26 VITALS — BP 122/84 | HR 77 | Temp 97.7°F | Wt 285.0 lb

## 2011-08-26 DIAGNOSIS — F430 Acute stress reaction: Secondary | ICD-10-CM

## 2011-08-26 DIAGNOSIS — F439 Reaction to severe stress, unspecified: Secondary | ICD-10-CM | POA: Insufficient documentation

## 2011-08-26 DIAGNOSIS — J40 Bronchitis, not specified as acute or chronic: Secondary | ICD-10-CM

## 2011-08-26 MED ORDER — HYDROCOD POLST-CHLORPHEN POLST 10-8 MG/5ML PO LQCR: 5.0000 mL | Freq: Every evening | ORAL | Status: DC | PRN

## 2011-08-26 MED ORDER — CITALOPRAM HYDROBROMIDE 20 MG PO TABS: 20.0000 mg | ORAL_TABLET | Freq: Every day | ORAL | Status: DC

## 2011-08-26 NOTE — Patient Instructions (Addendum)
Start on the celexa and consider counseling.  Let me know if the medicine isn't helping after a few weeks.   Use the cough medicine and the cough should gradually get better.  Take care.

## 2011-08-26 NOTE — Assessment & Plan Note (Signed)
Presumed, resolved, minimal cough now.  Ctab.  Can use tussionex for remaining cough.

## 2011-08-26 NOTE — Assessment & Plan Note (Signed)
D/w pt.  No SI/HI.  Will start SSRI with routine cautions.  She's working on changes at home to mitigate some of the stressors.  She'll consider counseling.  >25 min spent with face to face with patient, >50% counseling and/or coordinating care

## 2011-08-26 NOTE — Progress Notes (Signed)
Frequently travelling, at home 1 week in last 2 months.  Recently with runny nose, then drainage, then cough.  Sputum production with cough.  Taking guaifenesin and some tussinex.  She took some left over levaquin.  She didn't have a fever.  She is better now.  Sick with worst sx for 3 days.  Sick overall for 10 days.  Still with some cough and rhinorrhea.  Took 7 days of levaquin.  Done now.    She has more stress with family/farm/work stress, husband's illness.  She was asking about options.  She has prev gone to counseling prev but not recently.  We talked about options.  She's been more irritable and tearful.  No SI/HI.    Meds, vitals, and allergies reviewed.   ROS: See HPI.  Otherwise, noncontributory.  GEN: nad, alert and oriented, overweight HEENT: mucous membranes moist, tm w/o erythema, nasal exam w/o erythema, scant clear discharge noted,  OP with mild cobblestoning NECK: supple w/o LA CV: rrr.   PULM: ctab, no inc wob EXT: no edema SKIN: no acute rash Affect wnl during exam.  Speech and judgement wnl.

## 2012-01-17 ENCOUNTER — Other Ambulatory Visit: Payer: Self-pay | Admitting: Family Medicine

## 2012-03-09 DIAGNOSIS — D47Z9 Other specified neoplasms of uncertain behavior of lymphoid, hematopoietic and related tissue: Secondary | ICD-10-CM | POA: Insufficient documentation

## 2012-03-09 DIAGNOSIS — Z85828 Personal history of other malignant neoplasm of skin: Secondary | ICD-10-CM | POA: Insufficient documentation

## 2012-04-30 ENCOUNTER — Other Ambulatory Visit: Payer: Self-pay | Admitting: Family Medicine

## 2012-05-03 NOTE — Telephone Encounter (Signed)
Sent, due for CPE at 50.

## 2012-05-03 NOTE — Telephone Encounter (Signed)
Patient advised.  She has labs, EKG, etc done with her occupational health exam and will bring in those reports when she schedules the examination.

## 2012-09-16 IMAGING — CR DG LUMBAR SPINE COMPLETE 4+V
5 series · 5 of 5 positions shown · non-contrast
Comparison: None.

CLINICAL DATA: History of left leg radiculopathy.  Leg pain.  Back
pain.

LUMBAR SPINE - COMPLETE 4+ VIEW

[view not recorded (1 of 5)]
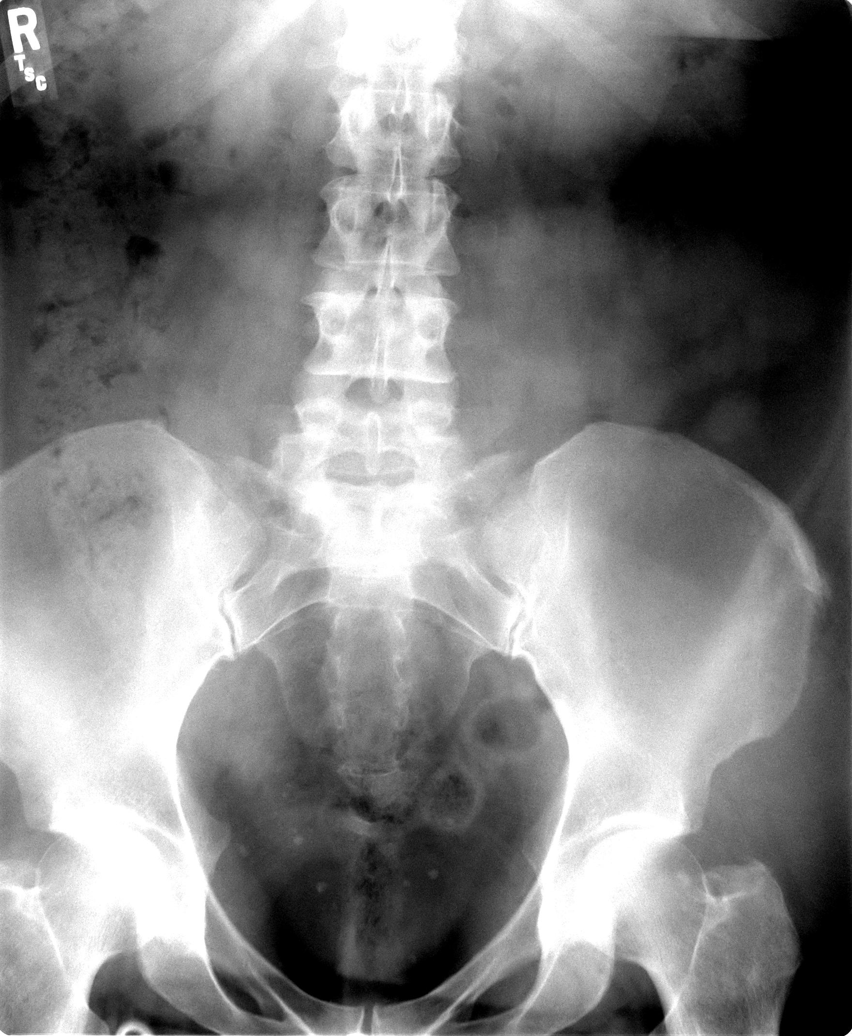

[view not recorded (2 of 5)]
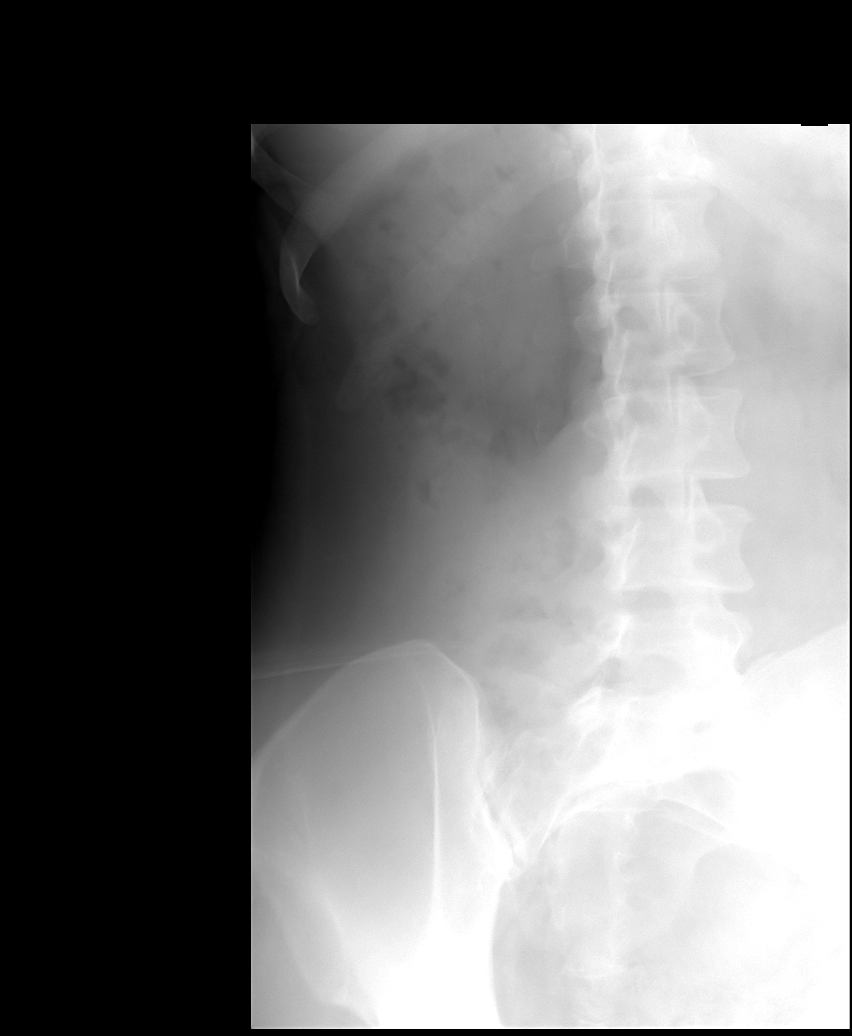

[view not recorded (3 of 5)]
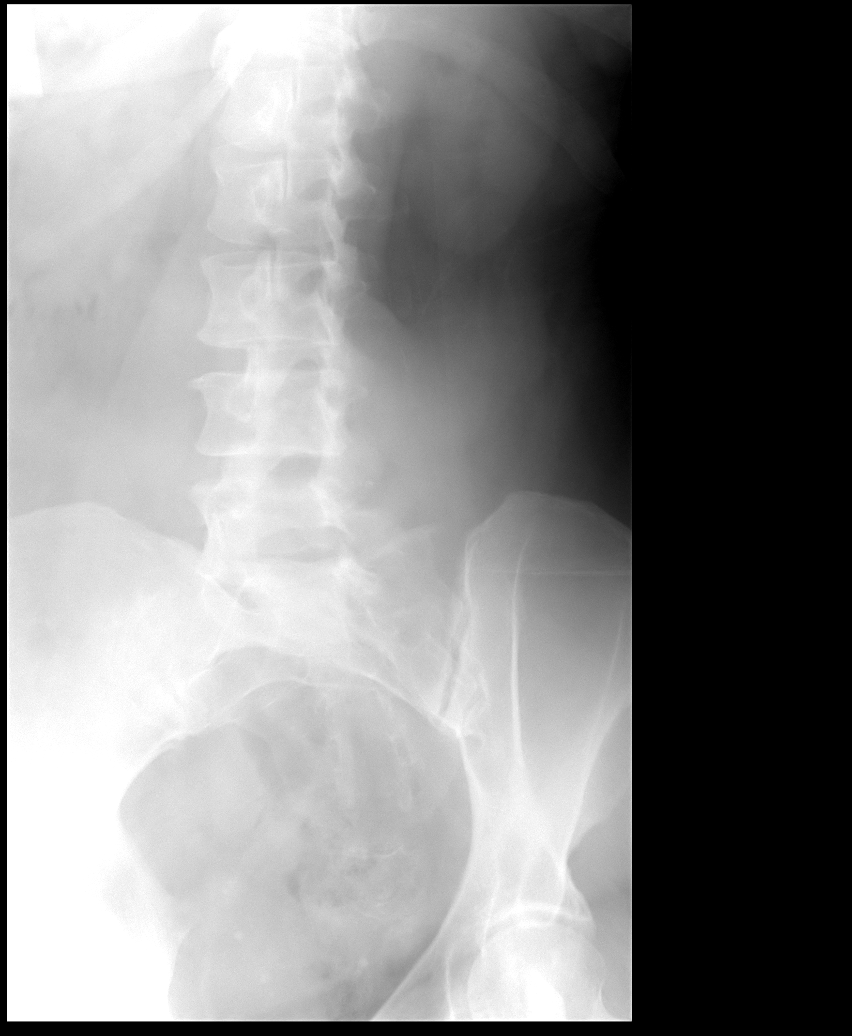

[view not recorded (4 of 5)]
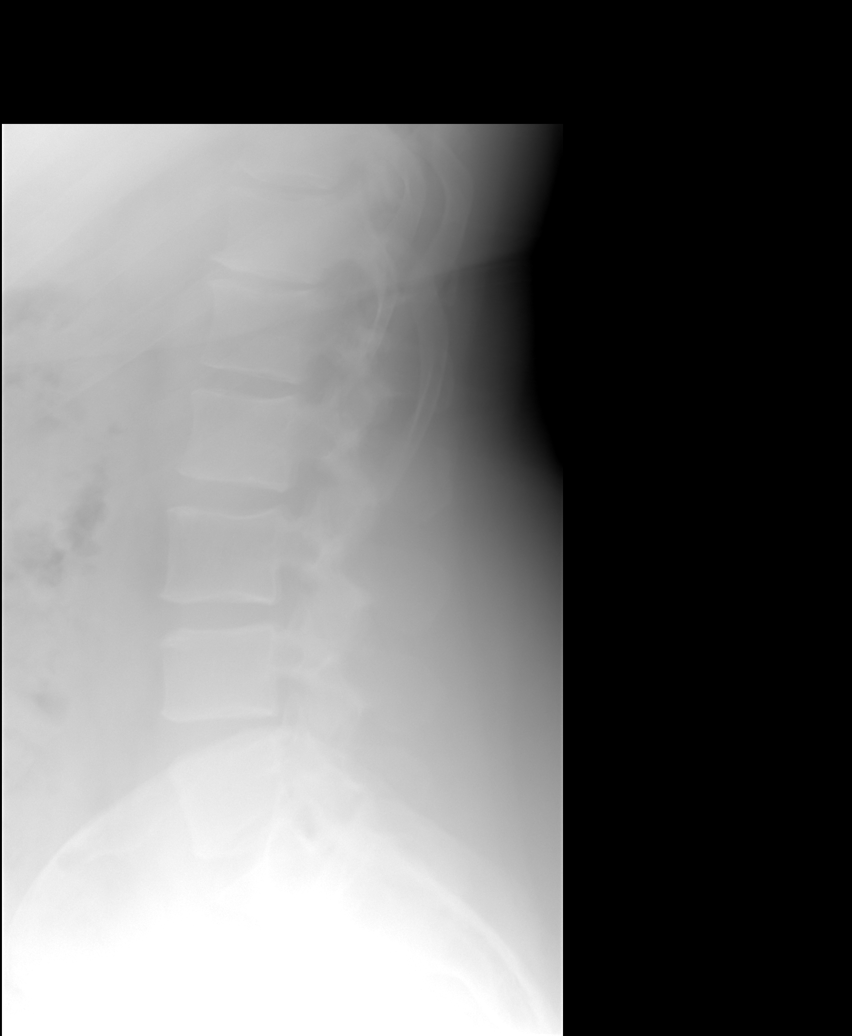

[view not recorded (5 of 5)]
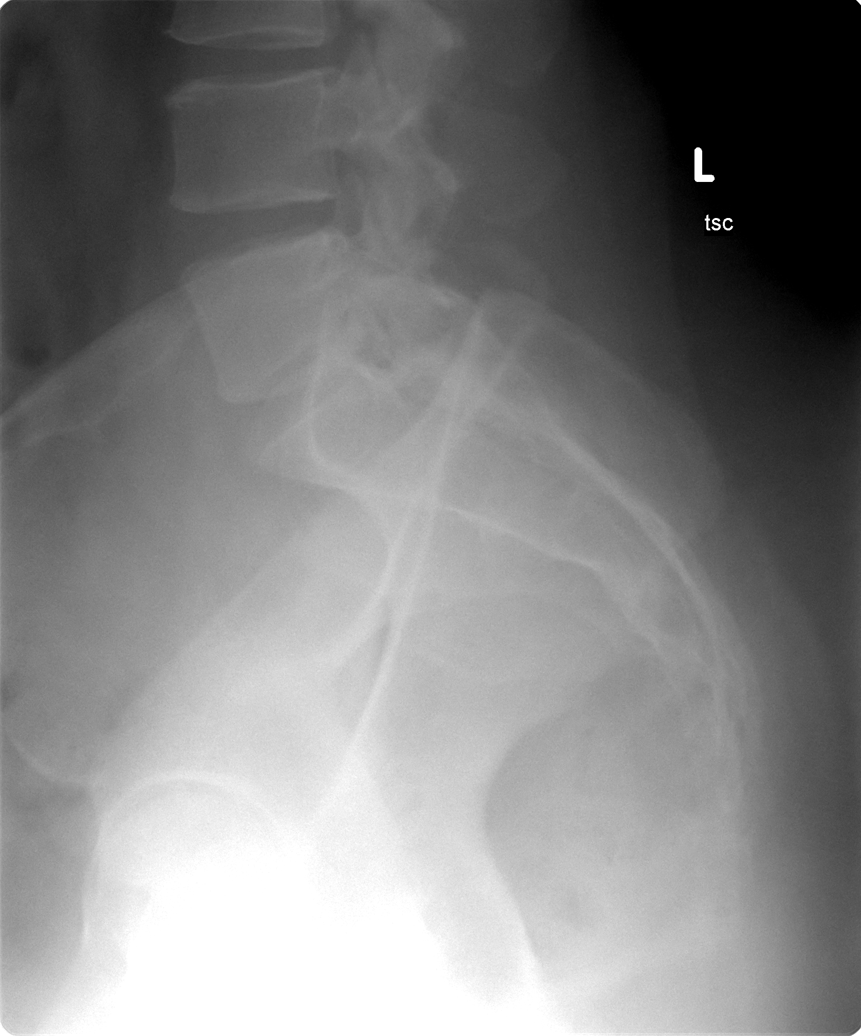

[5 of 5 positions shown; findings below may reference images not displayed]

FINDINGS: There is moderate fecal distention of portions of the
colon. Pelvic phleboliths are seen.

SI joints appear intact.

There are five non-rib bearing lumbar-type vertebral bodies which
are labeled L1-L5.  Intervertebral disc spaces appear preserved.
No fracture, subluxation, bony destruction, or pars defect is
evident.  There is minimal marginal osteophyte formation
representing degenerative spondylosis.
IMPRESSION: Minimal degenerative spondylosis.  Moderate fecal distention of
portions of the colon.

## 2012-11-03 ENCOUNTER — Telehealth: Payer: Self-pay | Admitting: Family Medicine

## 2012-11-03 NOTE — Telephone Encounter (Signed)
Patient Information:  Caller Name: Kinzly  Phone: [phone]  Patient: Joann Mendez, Joann Mendez  Gender: Female  DOB: [birth year]  Age: 52 Years  PCP: Crawford Givens Clelia Croft) [facility])  Pregnant: No  Office Follow Up:  Does the office need to follow up with this patient?: No  Instructions For The Office: N/A  RN Note:  Already has appointment scheduled for 11/05/2012 at 09:30 with Dr. Para March  Symptoms  Reason For Call & Symptoms: Onset 2 week ago left foot open area (friction rub from tennis shoe) and now after different bandaging is worsened and the size of a fist.  Reviewed Health History In EMR: Yes  Reviewed Medications In EMR: Yes  Reviewed Allergies In EMR: Yes  Reviewed Surgeries / Procedures: Yes  Date of Onset of Symptoms: 10/27/2012  Treatments Tried: Band-Aid, silk tape, paper tape and now just gauze  Treatments Tried Worked: No OB / GYN:  LMP: Unknown  Guideline(s) Used:  Ankle and Foot Injury  Disposition Per Guideline:   Home Care  Reason For Disposition Reached:   Minor ankle or foot injury  Advice Given:  Reassurance - Superficial Laceration (Cut or Scratch) or Abrasion (Scrape):  Here is some care advice that should help.  Bleeding  : Apply direct pressure for 10 minutes with a sterile gauze to stop any bleeding.  Cleaning the Wound:  Wash the wound with soap and water for 5 minutes.  For any dirt, scrub gently with a wash cloth.  For any bleeding, apply direct pressure with a sterile gauze or clean cloth for 10 minutes.  Antibiotic Ointment  Apply an Antibiotic Ointment (e.g., OTC Bacitracin), covered by a Band-Aid or dressing. Change daily or if it becomes wet.  Option: A TEFLA dressing won't stick to the wound when it is removed.  Option: Another option is to use a Liquid Skin Bandage that only needs to be applied once. Don't use antibiotic ointment if you use a liquid skin bandage.  Liquid Skin Bandage:  You can use a liquid skin bandage instead of  antibiotic ointment and a dressing or a Band-Aid.  Benefits: Liquid skin bandage has several benefits when compared to a regular bandage (e.g., a dressing or a Band-Aid). You only need to put a liquid bandage on once to minor cuts and scrapes. It helps stop minor bleeding. It seals the wound and may promote faster healing and lower infection rates. However, it is also more expensive.  How To Use It: First clean and dry the wound. You put on the liquid as spray or with a swab. It dries in less than a minute and usually lasts a week. You can get it wet.  Examples: Liquid skin bandage is available over-the-counter. Examples include: Band-Aid Liquid Bandage, New Skin, Curad Spray Bandage, and 21M No Sting Liquid Bandage Spray.  Call Back If:  Looks infected (pus, redness, increasing tenderness)  Doesn't heal within 10 days  You become worse.  Patient Will Follow Care Advice:  YES

## 2012-11-04 NOTE — Telephone Encounter (Signed)
Will see tomorrow

## 2012-11-05 ENCOUNTER — Ambulatory Visit (INDEPENDENT_AMBULATORY_CARE_PROVIDER_SITE_OTHER): Payer: Federal, State, Local not specified - PPO | Admitting: Family Medicine

## 2012-11-05 ENCOUNTER — Encounter: Payer: Self-pay | Admitting: Family Medicine

## 2012-11-05 VITALS — BP 114/80 | HR 64 | Temp 97.9°F | Wt 287.5 lb

## 2012-11-05 DIAGNOSIS — R238 Other skin changes: Secondary | ICD-10-CM

## 2012-11-05 DIAGNOSIS — L989 Disorder of the skin and subcutaneous tissue, unspecified: Secondary | ICD-10-CM

## 2012-11-05 DIAGNOSIS — Z733 Stress, not elsewhere classified: Secondary | ICD-10-CM

## 2012-11-05 DIAGNOSIS — F439 Reaction to severe stress, unspecified: Secondary | ICD-10-CM

## 2012-11-05 MED ORDER — CITALOPRAM HYDROBROMIDE 20 MG PO TABS: 20.0000 mg | ORAL_TABLET | Freq: Every day | ORAL | Status: DC

## 2012-11-05 MED ORDER — CLOBETASOL PROPIONATE 0.05 % EX CREA: TOPICAL_CREAM | Freq: Two times a day (BID) | CUTANEOUS | Status: DC

## 2012-11-05 NOTE — Patient Instructions (Signed)
Use nonstick bandages and then a wrap in the AM.  Use the cream w/o a wrap at night.  Avoid tape.  This should gradually heal.  Take care.

## 2012-11-05 NOTE — Progress Notes (Signed)
L shoe rubbed dorsum of the foot.  Successive bandages "peeled the hide off."  She put clobetasol on the area recently.  Neosporin made it weep more.    She found a new farm and they have moved.  His family cut them out in the meantime.  She and her husband are getting by on her salary.  She wanted to continue the SSRI.  She feels better with the situation. It is more relaxing.    Meds, vitals, and allergies reviewed.   ROS: See HPI.  Otherwise, noncontributory.  nad Mood and speech wnl L foot with normal inspection except for irritation on the dorsum of the foot.  Normal DP pulse.  No fluctuant mass.  No ulceration. NV intact

## 2012-11-07 DIAGNOSIS — R238 Other skin changes: Secondary | ICD-10-CM | POA: Insufficient documentation

## 2012-11-07 NOTE — Assessment & Plan Note (Signed)
Doesn't look infected.  Continue nonstick bandage during the day, then use topical steroid qhs w/o occlusive bandage. Should resolve.  Avoid irritating adhesives.  She agrees.

## 2012-11-07 NOTE — Assessment & Plan Note (Signed)
Continue current med 

## 2012-11-10 ENCOUNTER — Encounter: Payer: Self-pay | Admitting: Podiatry

## 2012-11-10 ENCOUNTER — Ambulatory Visit (INDEPENDENT_AMBULATORY_CARE_PROVIDER_SITE_OTHER): Payer: Federal, State, Local not specified - PPO | Admitting: Podiatry

## 2012-11-10 VITALS — BP 131/66 | HR 62 | Resp 16 | Ht 67.0 in | Wt 284.0 lb

## 2012-11-10 DIAGNOSIS — L259 Unspecified contact dermatitis, unspecified cause: Secondary | ICD-10-CM

## 2012-11-10 MED ORDER — METHYLPREDNISOLONE (PAK) 4 MG PO TABS: ORAL_TABLET | ORAL | Status: DC

## 2012-11-10 MED ORDER — CLINDAMYCIN HCL 150 MG PO CAPS: 150.0000 mg | ORAL_CAPSULE | Freq: Three times a day (TID) | ORAL | Status: DC

## 2012-11-10 NOTE — Patient Instructions (Signed)
Epsom salt soaks twice daily Aquaphor oint after soaks twice daily Clean dry sock Oral medication

## 2012-11-10 NOTE — Progress Notes (Signed)
Joann Mendez, a 52 year old white female, presents today with a rash the dorsal aspect of her left foot. She states that the rash began about 2 weeks ago from area that was rubbing her shoe. She states that she put a small tape Band-Aid type dressing on the area that was rubbing. Once the tape was removed then it left a red rash. She covered that with more taken bandage and that skin peeled off as well. She said visit her primary Dr. redressed the foot with a dressing. This seem to irritate the foot and cause it to swell. Once again more skin sloughed off. She has been using Nizoral, mistakingly thinking that it was a steroid. She denies getting into any poisonous plants. The rash didn't come out into she is tape on her skin. She denies fever chills nausea vomiting and any other illness.  Objective: Vital signs are stable she is alert and oriented x3. Pulses are palpable and strong to the left foot. Mild edema to the left foot. She has erythema and large plaque to the left foot with overlying Silver scales, resembling psoriasis. She also has large fascicular pustules and small vesicular lesions just beneath the skin. I am able to tell that she has been in contact with the tape where the margin is visible. He does appear to be an atopic dermatitis or psoriasis. The smaller purulent-type vesicles suggest mild infection.  Assessment: Probable contact dermatitis we need to rule out psoriatic dermatitis.  Plan: Started her on a Medrol Dosepak. Started her on Keflex 500 3 times a day. She will start soaking in Epsom salts in warm order to drive these fascicular lesions. Apply very small amount of Aquaphor. Followup with me in 2 weeks. Should she start to develop signs or symptoms of systemic infection she will go to the hospital.

## 2012-12-01 ENCOUNTER — Encounter: Payer: Self-pay | Admitting: Podiatry

## 2012-12-01 ENCOUNTER — Ambulatory Visit (INDEPENDENT_AMBULATORY_CARE_PROVIDER_SITE_OTHER): Payer: Federal, State, Local not specified - PPO | Admitting: Podiatry

## 2012-12-01 VITALS — BP 75/50 | HR 78 | Resp 16 | Ht 67.0 in | Wt 291.0 lb

## 2012-12-01 DIAGNOSIS — L309 Dermatitis, unspecified: Secondary | ICD-10-CM

## 2012-12-01 DIAGNOSIS — L259 Unspecified contact dermatitis, unspecified cause: Secondary | ICD-10-CM

## 2012-12-01 NOTE — Progress Notes (Signed)
Joann Mendez presents today for followup of atopic dermatitis to the dorsal aspect of her left foot. When she was last and we put her on prednisone, salt soaks and Aquaphor ointment. She was unable to take the steroids that we have provided her due to pruritus.  Objective: Much improved dermatitis dorsal aspect left foot. Much decrease in erythema and edema and vesicular eruption. One small area of drainage on the dorsal lateral aspect a central lesion left foot. No current signs of infection.  Assessment: Well-healing dermatitis.  Plan: Continue conservative therapies.

## 2013-01-05 ENCOUNTER — Ambulatory Visit: Payer: Federal, State, Local not specified - PPO | Admitting: Family Medicine

## 2013-01-05 ENCOUNTER — Ambulatory Visit (INDEPENDENT_AMBULATORY_CARE_PROVIDER_SITE_OTHER): Payer: Federal, State, Local not specified - PPO | Admitting: Family Medicine

## 2013-01-05 ENCOUNTER — Encounter: Payer: Self-pay | Admitting: Podiatry

## 2013-01-05 ENCOUNTER — Ambulatory Visit (INDEPENDENT_AMBULATORY_CARE_PROVIDER_SITE_OTHER): Payer: Federal, State, Local not specified - PPO | Admitting: Podiatry

## 2013-01-05 ENCOUNTER — Encounter: Payer: Self-pay | Admitting: Family Medicine

## 2013-01-05 VITALS — BP 161/85 | HR 67 | Resp 16

## 2013-01-05 VITALS — BP 124/86 | HR 70 | Temp 97.7°F | Wt 288.0 lb

## 2013-01-05 DIAGNOSIS — L259 Unspecified contact dermatitis, unspecified cause: Secondary | ICD-10-CM

## 2013-01-05 DIAGNOSIS — T148 Other injury of unspecified body region: Secondary | ICD-10-CM

## 2013-01-05 DIAGNOSIS — W57XXXA Bitten or stung by nonvenomous insect and other nonvenomous arthropods, initial encounter: Secondary | ICD-10-CM

## 2013-01-05 MED ORDER — DOXYCYCLINE HYCLATE 100 MG PO TABS: 100.0000 mg | ORAL_TABLET | Freq: Two times a day (BID) | ORAL | Status: DC

## 2013-01-05 NOTE — Progress Notes (Signed)
Pre-visit discussion using our clinic review tool. No additional management support is needed unless otherwise documented below in the visit note.  Her home situation is improved after the move.    L elbow.  Noted a tick near the antecubital fossa.  It was attached.  Removed with tweezers, didn't come out easily initially.  She went back in to get the head out with the tweezers, successfully.  She put neosporin on the area but then had whelps from that.  No fevers, chills, unexplained rash, aches.    She had had mult skin eruptions with bandages previously.  She has clobetasol ordered per outside clinic.  This is near the ankle and predates all issues with the tick.    Meds, vitals, and allergies reviewed.   ROS: See HPI.  Otherwise, noncontributory.  nad L elbow with irritation at the bite site but no FB seen under magnification.  No spreading erythema.   No bullseye lesion Peripheral whelps noted at the area where she put neosporin.  Ankle with chronic dermatitis rash noted (old finding)

## 2013-01-05 NOTE — Progress Notes (Signed)
   Subjective:    Patient ID: Joann Mendez, female    DOB: Jan 15, 1961, 52 y.o.   MRN: [id number]  HPI Comments: "follow up from all the blisters that was all over my foot, i want him to check it i finished all the therapies but its spread, and when i get out of shower it itches , i cant take the prednisone.  and there is a sore spot on the bottom of my left foot     Review of Systems     Objective:   Physical Exam: Pulses are strongly palpable data bilateral lower extremity she still demonstrates a contact dermatitis with small vesicular eruption as to the dorsal aspect of the left foot very distinct outline of a contact type dermatitis to the posterior aspect of the right heel and ankle.        Assessment & Plan:  Assessment: Contact dermatitis.  Plan: Start clobetasol will followup with her in a couple weeks if she is no better punch biopsy will be necessary.

## 2013-01-05 NOTE — Patient Instructions (Signed)
Hold the doxycycline for now.  If you have joint aches, a new rash, or fevers, then start the antibiotics.

## 2013-01-06 DIAGNOSIS — W57XXXA Bitten or stung by nonvenomous insect and other nonvenomous arthropods, initial encounter: Secondary | ICD-10-CM | POA: Insufficient documentation

## 2013-01-06 NOTE — Assessment & Plan Note (Signed)
Doesn't look locally infected.  All skin lesions are benign appearing or explained by other causes.   Hold doxy for now.  See instructions.  Low risk for tick related disease.  Other skin changes should resolve- if foot dermatitis doesn't resolve, we can refer to derm. She agrees.

## 2013-01-10 ENCOUNTER — Encounter: Payer: Self-pay | Admitting: Family Medicine

## 2013-01-10 ENCOUNTER — Ambulatory Visit (INDEPENDENT_AMBULATORY_CARE_PROVIDER_SITE_OTHER): Payer: Federal, State, Local not specified - PPO | Admitting: Family Medicine

## 2013-01-10 VITALS — BP 118/78 | HR 83 | Temp 99.1°F | Wt 283.5 lb

## 2013-01-10 DIAGNOSIS — R509 Fever, unspecified: Secondary | ICD-10-CM

## 2013-01-10 DIAGNOSIS — R059 Cough, unspecified: Secondary | ICD-10-CM

## 2013-01-10 DIAGNOSIS — R05 Cough: Secondary | ICD-10-CM | POA: Insufficient documentation

## 2013-01-10 LAB — POCT INFLUENZA A/B: Influenza B, POC: NEGATIVE

## 2013-01-10 MED ORDER — HYDROCOD POLST-CHLORPHEN POLST 10-8 MG/5ML PO LQCR: 5.0000 mL | Freq: Every evening | ORAL | Status: DC | PRN

## 2013-01-10 MED ORDER — ALBUTEROL SULFATE (2.5 MG/3ML) 0.083% IN NEBU
2.5000 mg | INHALATION_SOLUTION | Freq: Once | RESPIRATORY_TRACT | Status: AC
  Administered 2013-01-10: 2.5 mg via RESPIRATORY_TRACT

## 2013-01-10 MED ORDER — ALBUTEROL SULFATE HFA 108 (90 BASE) MCG/ACT IN AERS: 2.0000 | INHALATION_SPRAY | Freq: Four times a day (QID) | RESPIRATORY_TRACT | Status: DC | PRN

## 2013-01-10 NOTE — Assessment & Plan Note (Signed)
Likely viral, no indication for abx now.  Not at all likely to be related to the prev insect bite.  Improved with SABA.  Likely viral bronchitis. ddx dw pt.  Use SABA and cough medicine with routine cautions.  F/u prn.   Flu neg.

## 2013-01-10 NOTE — Progress Notes (Signed)
Pre-visit discussion using our clinic review tool. No additional management support is needed unless otherwise documented below in the visit note.  She has the rx for doxy but she hasn't started it yet.  Her elbow is better with resolving tick bite site irritation. No other new rash.    Sick contacts at home.  Sx started yesterday.  Cough.  Fever. Sweaty and clammy feeling.  She went to bed last night at 6pm and then got up today at new. Nasal congestion.  Profound fatigue.  Sputum, brown.  Aches diffusely.  Had a flu shot.  Cough with a deep breath.  No new rash.    Meds, vitals, and allergies reviewed.   ROS: See HPI.  Otherwise, noncontributory.  GEN: nad, alert and oriented HEENT: mucous membranes moist, tm w/o erythema, nasal exam w/o erythema, clear discharge noted,  OP with cobblestoning NECK: supple w/o LA CV: rrr.   PULM: ctab, no inc wob, dry cough intially EXT: no edema SKIN: no acute rash, resolving irritation on the L antecubital area  Recheck after neg.  No inc in wob, ctab, dec in cough.

## 2013-01-10 NOTE — Patient Instructions (Signed)
Use the cough medicine and inhaler as needed for now.  The cough medicine can make you drowsy.  If not improved in a few days then notify me. Take care.

## 2013-01-12 ENCOUNTER — Telehealth: Payer: Self-pay | Admitting: Family Medicine

## 2013-01-12 NOTE — Telephone Encounter (Signed)
Needed a work note.  Done.   She is feeling some better today.  May be able to go back to work soon.   Hasn't had to start doxy in meantime.

## 2013-02-16 ENCOUNTER — Encounter: Payer: Self-pay | Admitting: Family Medicine

## 2013-02-16 ENCOUNTER — Ambulatory Visit (INDEPENDENT_AMBULATORY_CARE_PROVIDER_SITE_OTHER): Payer: Federal, State, Local not specified - PPO | Admitting: Family Medicine

## 2013-02-16 ENCOUNTER — Encounter (HOSPITAL_COMMUNITY): Payer: Self-pay | Admitting: Emergency Medicine

## 2013-02-16 VITALS — BP 118/84 | HR 72 | Temp 98.0°F | Wt 290.5 lb

## 2013-02-16 DIAGNOSIS — R110 Nausea: Secondary | ICD-10-CM | POA: Insufficient documentation

## 2013-02-16 DIAGNOSIS — K589 Irritable bowel syndrome without diarrhea: Secondary | ICD-10-CM | POA: Insufficient documentation

## 2013-02-16 DIAGNOSIS — Z86718 Personal history of other venous thrombosis and embolism: Secondary | ICD-10-CM | POA: Insufficient documentation

## 2013-02-16 DIAGNOSIS — R238 Other skin changes: Secondary | ICD-10-CM

## 2013-02-16 DIAGNOSIS — W19XXXA Unspecified fall, initial encounter: Secondary | ICD-10-CM

## 2013-02-16 DIAGNOSIS — H8110 Benign paroxysmal vertigo, unspecified ear: Secondary | ICD-10-CM | POA: Insufficient documentation

## 2013-02-16 DIAGNOSIS — L989 Disorder of the skin and subcutaneous tissue, unspecified: Secondary | ICD-10-CM

## 2013-02-16 DIAGNOSIS — Z79899 Other long term (current) drug therapy: Secondary | ICD-10-CM | POA: Insufficient documentation

## 2013-02-16 DIAGNOSIS — Z7982 Long term (current) use of aspirin: Secondary | ICD-10-CM | POA: Insufficient documentation

## 2013-02-16 LAB — CBC
HEMATOCRIT: 40.2 % (ref 36.0–46.0)
Hemoglobin: 13.4 g/dL (ref 12.0–15.0)
MCH: 30.5 pg (ref 26.0–34.0)
MCHC: 33.3 g/dL (ref 30.0–36.0)
MCV: 91.4 fL (ref 78.0–100.0)
Platelets: 253 10*3/uL (ref 150–400)
RBC: 4.4 MIL/uL (ref 3.87–5.11)
RDW: 12.8 % (ref 11.5–15.5)
WBC: 14.1 10*3/uL — AB (ref 4.0–10.5)

## 2013-02-16 LAB — BASIC METABOLIC PANEL
BUN: 21 mg/dL (ref 6–23)
CALCIUM: 9.4 mg/dL (ref 8.4–10.5)
CO2: 26 mEq/L (ref 19–32)
Chloride: 102 mEq/L (ref 96–112)
Creatinine, Ser: 0.86 mg/dL (ref 0.50–1.10)
GFR calc Af Amer: 90 mL/min — ABNORMAL LOW (ref >90)
GFR calc non Af Amer: 77 mL/min — ABNORMAL LOW (ref >90)
Glucose, Bld: 121 mg/dL — ABNORMAL HIGH (ref 70–99)
Potassium: 3.8 mEq/L (ref 3.7–5.3)
SODIUM: 143 meq/L (ref 137–147)

## 2013-02-16 NOTE — Assessment & Plan Note (Signed)
Highly unlikely to have a fx, no LOC with the fall. Normal neuro exam.  Would use ibuprofen and f/u prn. The lightheadedness today appear to be isolated, resolved, not related.

## 2013-02-16 NOTE — ED Notes (Signed)
Pt c/o dizziness since 0600. N/V x 1. Pt recently had fall on Friday from walking on black ice. Land on back of neck and upper back. Seen and cleared by primary care provider Today. No facial Droop, no arm drift, and no neuro deficits noted. Pt states it feels like room is spinning.

## 2013-02-16 NOTE — Patient Instructions (Addendum)
Try the clobetasol twice a day for about 1 week.  If not better, then we need to get you to see the dermatology clinic.  Let me know if you want to go to nutrition locally.  I would take motrin with food for the pain.  The bruising should subside gradually.  Take care.

## 2013-02-16 NOTE — Progress Notes (Signed)
Pre-visit discussion using our clinic review tool. No additional management support is needed unless otherwise documented below in the visit note.  She fell on some ice Friday AM.  Slipped getting out of the car.  Hit her back and head on the running board of the truck.  She was able to get up.  Sore but no LOC.  She took motrin, kept moving to keep from getting stiff.  Taking ASA tid now.   This AM she was dizzy leaning over, self resolved.  BP was normal at work.  Single episode of dizziness, appeared to be positional.    She had a rash on the B forearms attributed to some soap, but it isn't getting better.  She is using a mild soap now.  It doesn't usually itch unless she takes a hot shower.  Clobetasol didn't help the rash, but it was only used occasionally.    Meds, vitals, and allergies reviewed.   ROS: See HPI.  Otherwise, noncontributory.  nad ncat Tm wnl Nasal and op exam wnl Neck supple, no LA rrr ctab abd soft Back with bruising on the L lower side noted, not point tender in the midline, normal back rom flex/ext/twisting B blanching irregular rash on the B forearms.   CN 2-12 wnl B, S/S/DTR wnl x4

## 2013-02-16 NOTE — Assessment & Plan Note (Signed)
If not resolved with topical steroid, then refer to derm.

## 2013-02-17 ENCOUNTER — Emergency Department (HOSPITAL_COMMUNITY)
Admission: EM | Admit: 2013-02-17 | Discharge: 2013-02-17 | Disposition: A | Discharge status: Home / Self Care | Payer: Federal, State, Local not specified - PPO | Attending: Emergency Medicine | Admitting: Emergency Medicine

## 2013-02-17 DIAGNOSIS — H8110 Benign paroxysmal vertigo, unspecified ear: Secondary | ICD-10-CM

## 2013-02-17 MED ORDER — MECLIZINE HCL 25 MG PO TABS
25.0000 mg | ORAL_TABLET | Freq: Once | ORAL | Status: AC
  Administered 2013-02-17: 25 mg via ORAL
  Filled 2013-02-17: qty 1

## 2013-02-17 MED ORDER — MECLIZINE HCL 25 MG PO TABS: 25.0000 mg | ORAL_TABLET | Freq: Three times a day (TID) | ORAL | Status: DC | PRN

## 2013-02-17 NOTE — ED Notes (Signed)
Patient here with positional dizziness. Earlier tonight felt extreme dizziness while bending over and afterward had episode of nausea with emesis.

## 2013-02-17 NOTE — ED Provider Notes (Signed)
CSN: [id number]     Arrival date & time 02/16/13  2236 History   First MD Initiated Contact with Patient 02/17/13 0151     Chief Complaint  Patient presents with  . Dizziness   (Consider location/radiation/quality/duration/timing/severity/associated sxs/prior Treatment) HPI This patient is a  53 yo woman who presents with several hours of positional vertigo. She has associated nausea. No tinnitus. No history of same. She is asymptomatic at rest. She denies ataxia. No headache. No h/o same   Past Medical History  Diagnosis Date  . Abdominal or pelvic swelling, mass or lump, unspecified site   . Other and unspecified special symptom or syndrome, not elsewhere classified   . Acute venous embolism and thrombosis of unspecified deep vessels of lower extremity   . Other congenital anomaly of toes   . Long term (current) use of anticoagulants   . Encounter for therapeutic drug monitoring   . Enlargement of lymph nodes   . Irritable bowel syndrome   . Phlebitis and thrombophlebitis of superficial vessels of lower extremities   . Abnormal weight gain   . Acute upper respiratory infections of unspecified site   . Asymptomatic varicose veins   . Strain of right Achilles tendon 8/11    per [facility] [facility]-no tear  . Right leg DVT 09/27/09    anticoagulation with coumadin possibly until 3/12   Past Surgical History  Procedure Laterality Date  . Nsvd      x2 (last 1991)  . Flexible sigmoidoscopy  2003    Mild active colitis (Dr. Vira Agar)  . Eye surgery Bilateral   . Mass on ovary    . Abdominal hysterectomy     Family History  Problem Relation Age of Onset  . Hypertension Father   . Coronary artery disease Father     s/p CABG and valve replacement after gallstone pancreatitis  . Atrial fibrillation Mother   . Deep vein thrombosis Mother   . Stomach cancer      GM   History  Substance Use Topics  . Smoking status: Never Smoker   . Smokeless tobacco: Never Used  . Alcohol Use: No   OB  History   Grav Para Term Preterm Abortions TAB SAB Ect Mult Living                 Review of Systems Ten point review of symptoms performed and is negative with the exception of symptoms noted above.   Allergies  Neosporin original; Prednisone; and Tape  Home Medications   Current Outpatient Rx  Name  Route  Sig  Dispense  Refill  . acetaminophen (TYLENOL) 325 MG tablet   Oral   Take 650 mg by mouth every 6 (six) hours as needed.           Marland Kitchen aspirin 325 MG tablet   Oral   Take 325 mg by mouth as needed.         Marland Kitchen aspirin 81 MG tablet   Oral   Take 81 mg by mouth daily.           . citalopram (CELEXA) 20 MG tablet   Oral   Take 1 tablet (20 mg total) by mouth daily.   90 tablet   3   . clobetasol cream (TEMOVATE) 0.05 %   Topical   Apply topically 2 (two) times daily as needed.         . docusate sodium (COLACE) 100 MG capsule   Oral   Take 100  mg by mouth daily.           Marland Kitchen ibuprofen (ADVIL,MOTRIN) 200 MG tablet   Oral   Take 200 mg by mouth every 6 (six) hours as needed.         Marland Kitchen ketoconazole (NIZORAL) 2 % cream      as needed.         . Multiple Vitamin (MULTIVITAMIN) tablet   Oral   Take 1 tablet by mouth daily.          BP 139/86  Pulse 71  Temp(Src) 97.7 F (36.5 C)  Resp 18  SpO2 100% Physical Exam Gen: well developed and well nourished appearing Head: NCAT Eyes: PERL, EOMI Nose: no epistaixis or rhinorrhea Mouth/throat: mucosa is moist and pink Neck: supple, no stridor Lungs: CTA B, no wheezing, rhonchi or rales CV: RRR, no murmur, extremities appear well perfused.  Abd: soft, notender, nondistended Back: no ttp, no cva ttp Skin: warm and dry Ext: normal to inspection, no dependent edema Neuro: CN ii-xii grossly intact, no focal deficits - 5/5 motor strength both arms and legs, positive Halpike Dix maneuver, finger to nose intact, normal speech.  Psyche; normal affect,  calm and cooperative.   ED Course  Procedures  (including critical care time) Labs Review Results for orders placed during the hospital encounter of 02/17/13 (from the past 24 hour(s))  CBC     Status: Abnormal   Collection Time    02/16/13 11:10 PM      Result Value Range   WBC 14.1 (*) 4.0 - 10.5 K/uL   RBC 4.40  3.87 - 5.11 MIL/uL   Hemoglobin 13.4  12.0 - 15.0 g/dL   HCT 40.2  36.0 - 46.0 %   MCV 91.4  78.0 - 100.0 fL   MCH 30.5  26.0 - 34.0 pg   MCHC 33.3  30.0 - 36.0 g/dL   RDW 12.8  11.5 - 15.5 %   Platelets 253  150 - 400 K/uL  BASIC METABOLIC PANEL     Status: Abnormal   Collection Time    02/16/13 11:10 PM      Result Value Range   Sodium 143  137 - 147 mEq/L   Potassium 3.8  3.7 - 5.3 mEq/L   Chloride 102  96 - 112 mEq/L   CO2 26  19 - 32 mEq/L   Glucose, Bld 121 (*) 70 - 99 mg/dL   BUN 21  6 - 23 mg/dL   Creatinine, Ser 0.86  0.50 - 1.10 mg/dL   Calcium 9.4  8.4 - 10.5 mg/dL   GFR calc non Af Amer 77 (*) >90 mL/min   GFR calc Af Amer 90 (*) >90 mL/min   EKG: nsr, no acute ischemic changes, normal intervals, normal axis, normal qrs complex MDM  Patient with signs and symptoms consistent with benign positional vertigo. She is noted to have a mild leukocytosis. She has had an excellent response to treatment with meclizine and now has complete resolution of her symptoms. She is stable for discharge with prescription for same and plan for outpatient followup along with return precautions.    Elyn Peers, MD 02/17/13 0330

## 2013-02-17 NOTE — Discharge Instructions (Signed)

## 2013-04-20 ENCOUNTER — Ambulatory Visit: Payer: Federal, State, Local not specified - PPO | Admitting: Podiatry

## 2013-04-25 ENCOUNTER — Ambulatory Visit (INDEPENDENT_AMBULATORY_CARE_PROVIDER_SITE_OTHER): Payer: Federal, State, Local not specified - PPO | Admitting: Podiatry

## 2013-04-25 ENCOUNTER — Encounter: Payer: Self-pay | Admitting: Podiatry

## 2013-04-25 VITALS — BP 144/88 | HR 71 | Resp 16

## 2013-04-25 DIAGNOSIS — Q828 Other specified congenital malformations of skin: Secondary | ICD-10-CM

## 2013-04-25 NOTE — Progress Notes (Signed)
She presents today for followup of her porokeratosis plantar aspect of her left foot.  Objective: Vital signs are stable she is alert oriented x3. Porokeratotic lesion was debrided today demonstrates no erythema edema cellulitis drainage or odor.  Assessment: Porokeratosis left foot.  Plan: Sharp debridement of lesion followup with me as needed.

## 2013-09-28 ENCOUNTER — Ambulatory Visit (INDEPENDENT_AMBULATORY_CARE_PROVIDER_SITE_OTHER): Payer: Federal, State, Local not specified - PPO | Admitting: Podiatry

## 2013-09-28 VITALS — BP 130/74 | HR 61 | Resp 16

## 2013-09-28 DIAGNOSIS — Q828 Other specified congenital malformations of skin: Secondary | ICD-10-CM

## 2013-09-28 NOTE — Progress Notes (Signed)
She presents today chief complaint of painful callus plantar aspect of the left foot.  Objective: Vital signs are stable she is alert and oriented x3. There is no erythema edema saline is drainage or odor just of porokeratotic lesion.  Assessment: Porokeratosis sub-second metatarsophalangeal joint left foot.  Plan: Debridement of reactive hyperkeratotic tissue today there

## 2013-09-30 ENCOUNTER — Other Ambulatory Visit: Payer: Self-pay | Admitting: *Deleted

## 2013-09-30 ENCOUNTER — Telehealth: Payer: Self-pay | Admitting: *Deleted

## 2013-09-30 MED ORDER — CEPHALEXIN 500 MG PO CAPS: 500.0000 mg | ORAL_CAPSULE | Freq: Three times a day (TID) | ORAL | Status: DC

## 2013-09-30 NOTE — Telephone Encounter (Signed)
Spoke with dr Milinda Pointer , stated soaks with warm water as well and keflex

## 2013-09-30 NOTE — Telephone Encounter (Signed)
Patient called and stated that she was here on Wednesday and dr Milinda Pointer and he sprayed adhesive on it and than wiped it off with alcohol , but woke up this morning and it  Hives and redness and itchy hot ,and blisters . Cant take prednisone, want to know what to do , i told patient to soak epsom salt and take a benadryl and i will contact dr Milinda Pointer .

## 2013-12-26 ENCOUNTER — Other Ambulatory Visit: Payer: Self-pay | Admitting: Family Medicine

## 2013-12-26 NOTE — Telephone Encounter (Signed)
Sent.  Schedule CPE.  Thanks.  

## 2013-12-26 NOTE — Telephone Encounter (Signed)
Received refill request electronically from pharmacy. Last office visit 02/16/13. Is it okay to refill medication?

## 2013-12-27 NOTE — Telephone Encounter (Signed)
Left detailed message on voicemail of cell phone. 

## 2014-01-18 ENCOUNTER — Telehealth: Payer: Self-pay

## 2014-01-18 NOTE — Telephone Encounter (Signed)
PCP removed as instructed. 

## 2014-01-18 NOTE — Telephone Encounter (Signed)
Notify pt. If she is sick and wheezing, she needs to be seen. I didn't send in the abx.  Thanks.

## 2014-01-18 NOTE — Telephone Encounter (Signed)
PLEASE NOTE: All timestamps contained within this report are represented as [place] Standard Time. CONFIDENTIALTY NOTICE: This fax transmission is intended only for the addressee. It contains information that is legally privileged, confidential or otherwise protected from use or disclosure. If you are not the intended recipient, you are strictly prohibited from reviewing, disclosing, copying using or disseminating any of this information or taking any action in reliance on or regarding this information. If you have received this fax in error, please notify us immediately by telephone so that we can arrange for its return to [place]. Phone: [phone], Toll-Free: [phone], Fax: [phone] Page: 1 of 2 Call Id: [id number] [place] Patient Name: RICHEL MILLSPAUGH Gender: Female DOB: Apr 27, 1960 Age: 53 Y 21 M 6 D Return Phone Number: [phone] (Primary), [phone] (Secondary) Address: [address] [place] [address] Client [facility] - Client Client Site [place] - Day Physician Renford Dills Contact Type Call Call Type Triage / Clinical Relationship To Patient Self Return Phone Number [phone] (Primary) Chief Complaint Cough Initial Comment Caller state jer husband was diagnosed with sinus infection. He was given a RX she was not and is now sick. Can she have a RX called in? She is out of town currently. PreDisposition Call Doctor Nurse Assessment Nurse: Vallery Sa, RN, Tye [place] Date/Time Eilene Ghazi Time): 01/18/2014 12:26:21 PM Confirm and document reason for call. If symptomatic, describe symptoms. ---Caller states she developed a cough several days ago and she thinks she caught an infection from her husband who was diagnosed with a sinus infection. No sinus pain. She developed fever yesterday (estimates her  temperature to be about 100-101). She developed wheezing yesterday. No severe struggling to breath. Has the patient traveled out of the country within the last 30 days? ---No Does the patient require triage? ---Yes Related visit to physician within the last 2 weeks? ---No Does the PT have any chronic conditions? (i.e. diabetes, asthma, etc.) ---No Did the patient indicate they were pregnant? ---No Guidelines Guideline Title Affirmed Question Affirmed Notes Nurse Date/Time (Eastern Time) Cough - Acute Productive Wheezing is present [place], RN, Tye [place] 01/18/2014 12:30:32 PM Disp. Time Eilene Ghazi Time) Disposition Final User 01/18/2014 12:40:31 PM See Physician within 4 Hours (or PCP triage) Yes [place], RN, Rosey [place] Understands: Yes Disagree/Comply: Comply PLEASE NOTE: All timestamps contained within this report are represented as [place] Standard Time. CONFIDENTIALTY NOTICE: This fax transmission is intended only for the addressee. It contains information that is legally privileged, confidential or otherwise protected from use or disclosure. If you are not the intended recipient, you are strictly prohibited from reviewing, disclosing, copying using or disseminating any of this information or taking any action in reliance on or regarding this information. If you have received this fax in error, please notify us immediately by telephone so that we can arrange for its return to [place]. Phone: [phone], Toll-Free: [phone], Fax: [phone] Page: 2 of 2 Call Id: [id number] Care Advice Given Per Guideline SEE PHYSICIAN WITHIN 4 HOURS (or PCP triage): * IF NO PCP TRIAGE: You need to be seen. Go to _______________ (ED/UCC or office if it will be open) within the next 3 or 4 hours. Go sooner if you become worse. AVOID TOBACCO SMOKE: Smoking or being exposed to smoke makes coughs much worse. CALL BACK IF: * You become worse. CARE ADVICE given per Cough - Acute Productive (Adult)  guideline. After Care  Instructions Given Call Event Type User Date / Time Description Comments User: Berton Mount, RN Date/Time Eilene Ghazi Time): 01/18/2014 12:40:20 PM  Caller states she is out of town and she requests an antibiotic called in for her. They will be coming back home this afternoon and she would like an antibiotic to be called in to the [facility] pharmacy down the road from the office. Called the office Appointment Line and Morey Hummingbird asks that I fax the request to the office and Mearl Latin will follow up with the MD regarding Venezia's request for the antibiotic. Referrals REFERRED TO PCP OFFICE

## 2014-01-18 NOTE — Telephone Encounter (Signed)
Patient notified as instructed by telephone. Patient stated that she is 800 miles away and will not be able to get home before the office closes Thursday. Advised patient that she may want to seek care at an Urgent Care where she is at. Patient stated that she will deal with it herself and to let Dr. Damita Dunnings know that she plans on finding a new doctor.

## 2014-01-18 NOTE — Telephone Encounter (Signed)
Noted, please remove me as the PCP 

## 2014-08-28 ENCOUNTER — Ambulatory Visit (INDEPENDENT_AMBULATORY_CARE_PROVIDER_SITE_OTHER): Payer: Federal, State, Local not specified - PPO | Admitting: Podiatry

## 2014-08-28 DIAGNOSIS — L309 Dermatitis, unspecified: Secondary | ICD-10-CM | POA: Diagnosis not present

## 2014-08-28 DIAGNOSIS — Q828 Other specified congenital malformations of skin: Secondary | ICD-10-CM | POA: Diagnosis not present

## 2014-08-28 MED ORDER — NAFTIFINE HCL 2 % EX GEL: CUTANEOUS | Status: DC

## 2014-08-28 NOTE — Progress Notes (Signed)
She presents today for follow-up and a chief complaint of porokeratosis plantar aspect of the bilateral foot she states that she is recently mental or show which resulted in blisters and itching to the plantar aspect of her toes bilateral left greater than right. She's done nothing for this. She states that she is not able to soak in Epsom salts and warm water unless it makes her more painful.  Objective: Vital signs are stable she is alert and oriented 3. Pulses are palpable bilateral. Multiple areas of vesicular tinea pedis to the plantar aspect of the bilateral toes. Porokeratosis are also noted bilateral. No open lesions are noted. Pulses remain palpable. No signs of bacterial infection.  Assessment: Porokeratosis and tinea pedis bilateral.  Plan: Applied Castellani's paint to the bilateral foot. Wrote a prescription for Naftin gel 2% to be applied twice a day until resolved. I also debrided all reactive hyperkeratoses for her. I will follow-up with her as needed.

## 2014-09-22 ENCOUNTER — Other Ambulatory Visit: Payer: Self-pay | Admitting: Family Medicine

## 2014-09-22 NOTE — Telephone Encounter (Signed)
Received a refill request for Celexa. Last refilled 12/26/13. Last office visit 02/16/13. Okay to refill?

## 2014-09-22 NOTE — Telephone Encounter (Signed)
Sent. Thanks.   

## 2014-10-09 ENCOUNTER — Ambulatory Visit: Payer: Federal, State, Local not specified - PPO | Admitting: Podiatry

## 2014-12-27 ENCOUNTER — Encounter: Payer: Self-pay | Admitting: Internal Medicine

## 2014-12-27 ENCOUNTER — Ambulatory Visit (INDEPENDENT_AMBULATORY_CARE_PROVIDER_SITE_OTHER): Payer: Federal, State, Local not specified - PPO | Admitting: Internal Medicine

## 2014-12-27 VITALS — BP 124/80 | HR 83 | Temp 98.8°F | Wt 268.0 lb

## 2014-12-27 DIAGNOSIS — J069 Acute upper respiratory infection, unspecified: Secondary | ICD-10-CM

## 2014-12-27 MED ORDER — HYDROCOD POLST-CPM POLST ER 10-8 MG/5ML PO SUER: 5.0000 mL | Freq: Every evening | ORAL | Status: DC | PRN

## 2014-12-27 MED ORDER — AZITHROMYCIN 250 MG PO TABS: ORAL_TABLET | ORAL | Status: DC

## 2014-12-27 NOTE — Patient Instructions (Signed)
Upper Respiratory Infection, Adult Most upper respiratory infections (URIs) are a viral infection of the air passages leading to the lungs. A URI affects the nose, throat, and upper air passages. The most common type of URI is nasopharyngitis and is typically referred to as "the common cold." URIs run their course and usually go away on their own. Most of the time, a URI does not require medical attention, but sometimes a bacterial infection in the upper airways can follow a viral infection. This is called a secondary infection. Sinus and middle ear infections are common types of secondary upper respiratory infections. Bacterial pneumonia can also complicate a URI. A URI can worsen asthma and chronic obstructive pulmonary disease (COPD). Sometimes, these complications can require emergency medical care and may be life threatening.  CAUSES Almost all URIs are caused by viruses. A virus is a type of germ and can spread from one person to another.  RISKS FACTORS You may be at risk for a URI if:   You smoke.   You have chronic heart or lung disease.  You have a weakened defense (immune) system.   You are very young or very old.   You have nasal allergies or asthma.  You work in crowded or poorly ventilated areas.  You work in health care facilities or schools. SIGNS AND SYMPTOMS  Symptoms typically develop 2-3 days after you come in contact with a cold virus. Most viral URIs last 7-10 days. However, viral URIs from the influenza virus (flu virus) can last 14-18 days and are typically more severe. Symptoms may include:   Runny or stuffy (congested) nose.   Sneezing.   Cough.   Sore throat.   Headache.   Fatigue.   Fever.   Loss of appetite.   Pain in your forehead, behind your eyes, and over your cheekbones (sinus pain).  Muscle aches.  DIAGNOSIS  Your health care provider may diagnose a URI by:  Physical exam.  Tests to check that your symptoms are not due to  another condition such as:  Strep throat.  Sinusitis.  Pneumonia.  Asthma. TREATMENT  A URI goes away on its own with time. It cannot be cured with medicines, but medicines may be prescribed or recommended to relieve symptoms. Medicines may help:  Reduce your fever.  Reduce your cough.  Relieve nasal congestion. HOME CARE INSTRUCTIONS   Take medicines only as directed by your health care provider.   Gargle warm saltwater or take cough drops to comfort your throat as directed by your health care provider.  Use a warm mist humidifier or inhale steam from a shower to increase air moisture. This may make it easier to breathe.  Drink enough fluid to keep your urine clear or pale yellow.   Eat soups and other clear broths and maintain good nutrition.   Rest as needed.   Return to work when your temperature has returned to normal or as your health care provider advises. You may need to stay home longer to avoid infecting others. You can also use a face mask and careful hand washing to prevent spread of the virus.  Increase the usage of your inhaler if you have asthma.   Do not use any tobacco products, including cigarettes, chewing tobacco, or electronic cigarettes. If you need help quitting, ask your health care provider. PREVENTION  The best way to protect yourself from getting a cold is to practice good hygiene.   Avoid oral or hand contact with people with cold   symptoms.   Wash your hands often if contact occurs.  There is no clear evidence that vitamin C, vitamin E, echinacea, or exercise reduces the chance of developing a cold. However, it is always recommended to get plenty of rest, exercise, and practice good nutrition.  SEEK MEDICAL CARE IF:   You are getting worse rather than better.   Your symptoms are not controlled by medicine.   You have chills.  You have worsening shortness of breath.  You have brown or red mucus.  You have yellow or brown nasal  discharge.  You have pain in your face, especially when you bend forward.  You have a fever.  You have swollen neck glands.  You have pain while swallowing.  You have white areas in the back of your throat. SEEK IMMEDIATE MEDICAL CARE IF:   You have severe or persistent:  Headache.  Ear pain.  Sinus pain.  Chest pain.  You have chronic lung disease and any of the following:  Wheezing.  Prolonged cough.  Coughing up blood.  A change in your usual mucus.  You have a stiff neck.  You have changes in your:  Vision.  Hearing.  Thinking.  Mood. MAKE SURE YOU:   Understand these instructions.  Will watch your condition.  Will get help right away if you are not doing well or get worse.   This information is not intended to replace advice given to you by your health care provider. Make sure you discuss any questions you have with your health care provider.   Document Released: 07/09/2000 Document Revised: 05/30/2014 Document Reviewed: 04/20/2013 Elsevier Interactive Patient Education 2016 Elsevier Inc.  

## 2014-12-27 NOTE — Progress Notes (Signed)
Pre visit review using our clinic review tool, if applicable. No additional management support is needed unless otherwise documented below in the visit note. 

## 2014-12-27 NOTE — Progress Notes (Signed)
HPI  Pt presents to the clinic today with c/o headache, runny nose, cough and chest congestion. This started 3-4 days ago. She is blowing clear mucous out of her nose. The cough is productive of green mucous. She has had some chest tightness and shortness of breath. She denies fevers but has had chills and body aches. She has tried Mucinex and Tylenol with minimal relief. She has no history of allergies or breathing problems. She has not had sick contacts that she is aware of. She does not smoke.  Review of Systems      Past Medical History  Diagnosis Date  . Abdominal or pelvic swelling, mass or lump, unspecified site   . Other and unspecified special symptom or syndrome, not elsewhere classified   . Acute venous embolism and thrombosis of unspecified deep vessels of lower extremity   . Other congenital anomaly of toes   . Long term (current) use of anticoagulants   . Encounter for therapeutic drug monitoring   . Enlargement of lymph nodes   . Irritable bowel syndrome   . Phlebitis and thrombophlebitis of superficial vessels of lower extremities   . Abnormal weight gain   . Acute upper respiratory infections of unspecified site   . Asymptomatic varicose veins   . Strain of right Achilles tendon 8/11    per [place] [facility]-no tear  . Right leg DVT 09/27/09    anticoagulation with coumadin possibly until 3/12    Family History  Problem Relation Age of Onset  . Hypertension Father   . Coronary artery disease Father     s/p CABG and valve replacement after gallstone pancreatitis  . Atrial fibrillation Mother   . Deep vein thrombosis Mother   . Stomach cancer      GM    Social History   Social History  . Marital Status: Married    Spouse Name: N/A  . Number of Children: 2  . Years of Education: N/A   Occupational History  . [employer] compliance officer    Social History Main Topics  . Smoking status: Never Smoker   . Smokeless tobacco: Never Used  . Alcohol Use: No  . Drug  Use: No  . Sexual Activity: Yes   Other Topics Concern  . Not on file   Social History Narrative   Remarried summer of 1997      2 daughters             Allergies  Allergen Reactions  . Neosporin Original [Bacitracin-Neomycin-Polymyxin] Hives  . Prednisone Itching  . Tape Rash     Constitutional: Positive headache, fatigue. Denies fever or abrupt weight changes.  HEENT:  Positive runny nose. Denies eye redness, eye pain, pressure behind the eyes, facial pain, nasal congestion, ear pain, ringing in the ears, wax buildup,or sore throat. Respiratory: Positive cough and shortness of breath. Denies difficulty breathing.  Cardiovascular: Denies chest pain, chest tightness, palpitations or swelling in the hands or feet.   No other specific complaints in a complete review of systems (except as listed in HPI above).  Objective:   BP 124/80 mmHg  Pulse 83  Temp(Src) 98.8 F (37.1 C) (Oral)  Wt 268 lb (121.564 kg)  SpO2 98%  Wt Readings from Last 3 Encounters:  12/27/14 268 lb (121.564 kg)  02/16/13 290 lb 8 oz (131.77 kg)  01/10/13 283 lb 8 oz (128.595 kg)     General: Appears her stated age, ill appearing in NAD. HEENT: Head: normal shape and size,  no sinus tenderness; Eyes: sclera white, no icterus, conjunctiva pink; Ears: Tm's gray and intact, normal light reflex; Nose: mucosa pink and moist, septum midline; Throat/Mouth: + PND. Teeth present, mucosa erythematous and moist, no exudate noted, no lesions or ulcerations noted.  Neck: No cervical lymphadenopathy.  Cardiovascular: Normal rate and rhythm. S1,S2 noted.  No murmur, rubs or gallops noted.  Pulmonary/Chest: Normal effort and positive vesicular breath sounds. No respiratory distress. No wheezes, rales or ronchi noted.      Assessment & Plan:   Upper Respiratory Infection:  Likely viral Get some rest and drink plenty of water Continue Mucinex, Ibuprofen as needed eRx for Azithromax x 5 days to take starting  Friday if symptoms persist or worsen eRx for Tussionex cough syrup Work note provided  RTC as needed or if symptoms persist.

## 2015-01-01 ENCOUNTER — Telehealth: Payer: Self-pay | Admitting: Certified Nurse Midwife

## 2015-01-01 NOTE — Telephone Encounter (Signed)
Pt called stating is not any better  Weak doesn't feel good  Head stopped up cough  Chest and head congestion Pt wanted to know what she needs to do  Can you call her in something else? Or do you want to see her  [facility]

## 2015-01-01 NOTE — Telephone Encounter (Signed)
Patient called.  Patient said she took her last Azithromax yesterday.

## 2015-01-01 NOTE — Telephone Encounter (Signed)
The antibiotic should still continue to work for another 5 days. Have her take Flonase, continue Mucinex and cough syrup. If no better by Friday, have her make follow up appt

## 2015-01-01 NOTE — Telephone Encounter (Signed)
Sent to [facility].

## 2015-01-01 NOTE — Telephone Encounter (Signed)
Did she take the Azithromax?

## 2015-01-01 NOTE — Telephone Encounter (Signed)
Pt is aware as instructed 

## 2015-05-05 ENCOUNTER — Other Ambulatory Visit: Payer: Self-pay | Admitting: Family Medicine

## 2015-05-07 NOTE — Telephone Encounter (Signed)
Electronic refill request. Last office visit:   Seen only once in 2016.  12/27/14 Acute ([facility]).  No CPE of recent.   Last Filled:    90 tablet 1 09/22/2014  Please advise.

## 2015-05-09 ENCOUNTER — Other Ambulatory Visit: Payer: Self-pay

## 2015-05-09 NOTE — Telephone Encounter (Signed)
See 05-05-15 phone note; opened this note in error.

## 2015-05-09 NOTE — Telephone Encounter (Signed)
I think this is the first time I've seen this request.  I don't recall getting it earlier.  Sent.  Needs CPE scheduled.  Thanks.

## 2015-05-09 NOTE — Telephone Encounter (Signed)
Joann Mendez with [facility] request status of citalpram refill.

## 2015-05-10 NOTE — Telephone Encounter (Signed)
You are no longer listed as the PCP.  This request came through but apparently went to Dalia Heading because she is listed as the PCP.

## 2015-05-10 NOTE — Telephone Encounter (Signed)
Patient did not changed PCP's.  Dalia Heading is her OB/GYN.  This must have been a mistake along the way.  Patient will bring her annual labs that are done at work and make an appt in June.

## 2015-05-10 NOTE — Telephone Encounter (Signed)
Thanks

## 2015-05-10 NOTE — Telephone Encounter (Signed)
Did she change PCPs? If she changed PCPs, then I need the rx retracted and rerouted to the new PCP.   If she didn't change, then needs CPE scheduled.   Thanks.

## 2015-05-10 NOTE — Telephone Encounter (Signed)
Left messages on cell and work phones, awaiting response.

## 2015-05-15 ENCOUNTER — Other Ambulatory Visit: Payer: Self-pay | Admitting: Internal Medicine

## 2015-05-18 DIAGNOSIS — Z1231 Encounter for screening mammogram for malignant neoplasm of breast: Secondary | ICD-10-CM | POA: Diagnosis not present

## 2015-05-18 LAB — HM MAMMOGRAPHY

## 2015-05-24 ENCOUNTER — Encounter: Payer: Self-pay | Admitting: Primary Care

## 2015-05-24 ENCOUNTER — Ambulatory Visit: Payer: Federal, State, Local not specified - PPO | Admitting: Family Medicine

## 2015-05-24 ENCOUNTER — Ambulatory Visit (INDEPENDENT_AMBULATORY_CARE_PROVIDER_SITE_OTHER): Payer: Federal, State, Local not specified - PPO | Admitting: Primary Care

## 2015-05-24 VITALS — BP 124/82 | HR 64 | Temp 98.0°F | Wt 273.4 lb

## 2015-05-24 DIAGNOSIS — J069 Acute upper respiratory infection, unspecified: Secondary | ICD-10-CM | POA: Diagnosis not present

## 2015-05-24 DIAGNOSIS — J209 Acute bronchitis, unspecified: Secondary | ICD-10-CM

## 2015-05-24 MED ORDER — HYDROCOD POLST-CPM POLST ER 10-8 MG/5ML PO SUER: 5.0000 mL | Freq: Every evening | ORAL | Status: DC | PRN

## 2015-05-24 MED ORDER — AZITHROMYCIN 250 MG PO TABS: ORAL_TABLET | ORAL | Status: DC

## 2015-05-24 NOTE — Progress Notes (Signed)
Pre visit review using our clinic review tool, if applicable. No additional management support is needed unless otherwise documented below in the visit note. 

## 2015-05-24 NOTE — Progress Notes (Signed)
Subjective:    Patient ID: Joann Mendez, female    DOB: 10/09/1960, 55 y.o.   MRN: [id number]  HPI  Joann Mendez is a 55 year old female who presents today with a chief complaint of cough. She also reports nasal congestion, sore throat, chest congestion, fatigue. Her cough is productive with green sputum. Her symptoms have been present for 2 weeks. Her family has the same symptoms and are being treated with antibiotics. She's taken Mucinex, Tussionex (with improvement in sleep), cough drops. Overall she's feeling about the same. Denies fevers, sinus pressure, sneezing.  Review of Systems  Constitutional: Positive for chills and fatigue. Negative for fever.  HENT: Positive for congestion, rhinorrhea and sore throat. Negative for ear pain and sinus pressure.   Respiratory: Positive for cough and wheezing. Negative for shortness of breath.   Cardiovascular: Negative for chest pain.  Musculoskeletal: Negative for myalgias.       Past Medical History  Diagnosis Date  . Abdominal or pelvic swelling, mass or lump, unspecified site   . Other and unspecified special symptom or syndrome, not elsewhere classified   . Acute venous embolism and thrombosis of unspecified deep vessels of lower extremity   . Other congenital anomaly of toes   . Long term (current) use of anticoagulants   . Encounter for therapeutic drug monitoring   . Enlargement of lymph nodes   . Irritable bowel syndrome   . Phlebitis and thrombophlebitis of superficial vessels of lower extremities   . Abnormal weight gain   . Acute upper respiratory infections of unspecified site   . Asymptomatic varicose veins   . Strain of right Achilles tendon 8/11    per [place] [facility]-no tear  . Right leg DVT 09/27/09    anticoagulation with coumadin possibly until 3/12     Social History   Social History  . Marital Status: Married    Spouse Name: N/A  . Number of Children: 2  . 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 Smoking status: Never Smoker   . Smokeless tobacco: Never Used  . Alcohol Use: No  . Drug Use: No  . Sexual Activity: Yes   Other Topics Concern  . Not on file   Social History Narrative   Remarried summer of 1997      2 daughters             Past Surgical History  Procedure Laterality Date  . Nsvd      x2 (last 1991)  . Flexible sigmoidoscopy  2003    Mild active colitis (Dr. Vira Agar)  . Eye surgery Bilateral   . Mass on ovary    . Abdominal hysterectomy      Family History  Problem Relation Age of Onset  . Hypertension Father   . Coronary artery disease Father     s/p CABG and valve replacement after gallstone pancreatitis  . Atrial fibrillation Mother   . Deep vein thrombosis Mother   . Stomach cancer      GM    Allergies  Allergen Reactions  . Tape Swelling and Rash    Rash, redness and swelling  . Neosporin Original [Bacitracin-Neomycin-Polymyxin] Hives  . Prednisone Itching    Current Outpatient Prescriptions on File Prior to Visit  Medication Sig Dispense Refill  . acetaminophen (TYLENOL) 325 MG tablet Take 650 mg by mouth every 6 (six) hours as needed for  mild pain.     Marland Kitchen aspirin 81 MG tablet Take 81 mg by mouth daily.      . citalopram (CELEXA) 20 MG tablet TAKE 1 TABLET (20 MG TOTAL) BY MOUTH DAILY. 90 tablet 0  . clobetasol cream (TEMOVATE) [id number] % Apply 1 application topically 2 (two) times daily as needed (rash).     Mariane Baumgarten Calcium (STOOL SOFTENER PO) Take 1 tablet by mouth as needed.    . docusate sodium (COLACE) 100 MG capsule Take 100 mg by mouth daily.      Marland Kitchen ibuprofen (ADVIL,MOTRIN) 200 MG tablet Take 400 mg by mouth every 6 (six) hours as needed for moderate pain.     . meclizine (ANTIVERT) 25 MG tablet Take 1 tablet (25 mg total) by mouth 3 (three) times daily as needed for dizziness. 30 tablet 0  . Multiple Vitamin (MULTIVITAMIN) tablet Take 1 tablet by mouth daily.      . Naftifine HCl 2 % GEL APPLY TO AFFECTED AREA TWICE DAILY 60 g 1   No current facility-administered medications on file prior to visit.    BP 124/82 mmHg  Pulse 64  Temp(Src) 98 F (36.7 C) (Oral)  Wt 273 lb 6.4 oz (124.013 kg)  SpO2 97%    Objective:   Physical Exam  Constitutional: She appears well-nourished. She appears ill.  HENT:  Right Ear: Tympanic membrane and ear canal normal.  Left Ear: Tympanic membrane and ear canal normal.  Nose: Mucosal edema present. Right sinus exhibits no maxillary sinus tenderness and no frontal sinus tenderness. Left sinus exhibits no maxillary sinus tenderness and no frontal sinus tenderness.  Mouth/Throat: Posterior oropharyngeal erythema present. No oropharyngeal exudate or posterior oropharyngeal edema.  Eyes: Conjunctivae are normal.  Neck: Neck supple.  Cardiovascular: Normal rate and regular rhythm.   Pulmonary/Chest: Effort normal. She has no wheezes. She has rales.  Lymphadenopathy:    She has no cervical adenopathy.  Skin: Skin is warm and dry.          Assessment & Plan:  Acute Bronchitis:  Cough, nasal congestion, fatigue x 2 weeks. Productive cough with green sputum. Entire family with same symptoms and on antibiotics. Exam with mostly clear lungs, some rales. Nasal mucosal edema. Appears ill. Give duration of symptoms and presentation, will treat. Rx for Zpak and Tussionex. Delsym for day cough. Fluids, rest, return precautions provided.

## 2015-05-24 NOTE — Patient Instructions (Signed)
Start Azithromycin antibiotics. Take 2 tablets by mouth today, then 1 tablet daily for 4 additional days.  You may take the Tussionex cough suppressant at bedtime as needed for cough and rest. Caution this medication contains codeine and will make you feel drowsy.  Try Delsum DM for daytime cough and congestion.  Ensure you are staying hydrated with water.  It was a pleasure meeting you!  Acute Bronchitis Bronchitis is inflammation of the airways that extend from the windpipe into the lungs (bronchi). The inflammation often causes mucus to develop. This leads to a cough, which is the most common symptom of bronchitis.  In acute bronchitis, the condition usually develops suddenly and goes away over time, usually in a couple weeks. Smoking, allergies, and asthma can make bronchitis worse. Repeated episodes of bronchitis may cause further lung problems.  CAUSES Acute bronchitis is most often caused by the same virus that causes a cold. The virus can spread from person to person (contagious) through coughing, sneezing, and touching contaminated objects. SIGNS AND SYMPTOMS   Cough.   Fever.   Coughing up mucus.   Body aches.   Chest congestion.   Chills.   Shortness of breath.   Sore throat.  DIAGNOSIS  Acute bronchitis is usually diagnosed through a physical exam. Your health care provider will also ask you questions about your medical history. Tests, such as chest X-rays, are sometimes done to rule out other conditions.  TREATMENT  Acute bronchitis usually goes away in a couple weeks. Oftentimes, no medical treatment is necessary. Medicines are sometimes given for relief of fever or cough. Antibiotic medicines are usually not needed but may be prescribed in certain situations. In some cases, an inhaler may be recommended to help reduce shortness of breath and control the cough. A cool mist vaporizer may also be used to help thin bronchial secretions and make it easier to clear  the chest.  HOME CARE INSTRUCTIONS  Get plenty of rest.   Drink enough fluids to keep your urine clear or pale yellow (unless you have a medical condition that requires fluid restriction). Increasing fluids may help thin your respiratory secretions (sputum) and reduce chest congestion, and it will prevent dehydration.   Take medicines only as directed by your health care provider.  If you were prescribed an antibiotic medicine, finish it all even if you start to feel better.  Avoid smoking and secondhand smoke. Exposure to cigarette smoke or irritating chemicals will make bronchitis worse. If you are a smoker, consider using nicotine gum or skin patches to help control withdrawal symptoms. Quitting smoking will help your lungs heal faster.   Reduce the chances of another bout of acute bronchitis by washing your hands frequently, avoiding people with cold symptoms, and trying not to touch your hands to your mouth, nose, or eyes.   Keep all follow-up visits as directed by your health care provider.  SEEK MEDICAL CARE IF: Your symptoms do not improve after 1 week of treatment.  SEEK IMMEDIATE MEDICAL CARE IF:  You develop an increased fever or chills.   You have chest pain.   You have severe shortness of breath.  You have bloody sputum.   You develop dehydration.  You faint or repeatedly feel like you are going to pass out.  You develop repeated vomiting.  You develop a severe headache. MAKE SURE YOU:   Understand these instructions.  Will watch your condition.  Will get help right away if you are not doing well or get  worse.   This information is not intended to replace advice given to you by your health care provider. Make sure you discuss any questions you have with your health care provider.   Document Released: 02/21/2004 Document Revised: 02/03/2014 Document Reviewed: 07/06/2012 Elsevier Interactive Patient Education [employer].

## 2015-06-08 DIAGNOSIS — H11442 Conjunctival cysts, left eye: Secondary | ICD-10-CM | POA: Diagnosis not present

## 2015-06-27 DIAGNOSIS — Z85828 Personal history of other malignant neoplasm of skin: Secondary | ICD-10-CM | POA: Diagnosis not present

## 2015-06-27 DIAGNOSIS — W57XXXA Bitten or stung by nonvenomous insect and other nonvenomous arthropods, initial encounter: Secondary | ICD-10-CM | POA: Diagnosis not present

## 2015-06-27 DIAGNOSIS — L821 Other seborrheic keratosis: Secondary | ICD-10-CM | POA: Diagnosis not present

## 2015-06-27 DIAGNOSIS — D485 Neoplasm of uncertain behavior of skin: Secondary | ICD-10-CM | POA: Diagnosis not present

## 2015-06-29 DIAGNOSIS — K080 Exfoliation of teeth due to systemic causes: Secondary | ICD-10-CM | POA: Diagnosis not present

## 2015-08-01 ENCOUNTER — Ambulatory Visit: Payer: Federal, State, Local not specified - PPO | Admitting: Family Medicine

## 2015-08-28 DIAGNOSIS — Z6841 Body Mass Index (BMI) 40.0 and over, adult: Secondary | ICD-10-CM | POA: Diagnosis not present

## 2015-08-28 DIAGNOSIS — E669 Obesity, unspecified: Secondary | ICD-10-CM | POA: Diagnosis not present

## 2015-09-12 ENCOUNTER — Ambulatory Visit: Payer: Federal, State, Local not specified - PPO | Admitting: Family Medicine

## 2015-11-02 DIAGNOSIS — G5602 Carpal tunnel syndrome, left upper limb: Secondary | ICD-10-CM | POA: Diagnosis not present

## 2015-11-05 DIAGNOSIS — Z6841 Body Mass Index (BMI) 40.0 and over, adult: Secondary | ICD-10-CM | POA: Diagnosis not present

## 2015-11-05 DIAGNOSIS — E669 Obesity, unspecified: Secondary | ICD-10-CM | POA: Diagnosis not present

## 2015-11-16 DIAGNOSIS — Z01419 Encounter for gynecological examination (general) (routine) without abnormal findings: Secondary | ICD-10-CM | POA: Diagnosis not present

## 2015-11-16 DIAGNOSIS — Z1211 Encounter for screening for malignant neoplasm of colon: Secondary | ICD-10-CM | POA: Diagnosis not present

## 2015-11-16 LAB — HM PAP SMEAR: HM PAP: NEGATIVE

## 2015-12-03 DIAGNOSIS — K080 Exfoliation of teeth due to systemic causes: Secondary | ICD-10-CM | POA: Diagnosis not present

## 2016-01-02 DIAGNOSIS — Z6841 Body Mass Index (BMI) 40.0 and over, adult: Secondary | ICD-10-CM | POA: Diagnosis not present

## 2016-03-30 ENCOUNTER — Other Ambulatory Visit: Payer: Self-pay | Admitting: Family Medicine

## 2016-03-31 NOTE — Telephone Encounter (Signed)
Last office visit 05/24/2015 K. Clark for URI.  Last seen by PCP 02/16/2013.  Refill?

## 2016-03-31 NOTE — Telephone Encounter (Signed)
Sent 1 month supply, needs CPE scheduled. Thanks.

## 2016-04-01 NOTE — Telephone Encounter (Signed)
Lm on pts vm and advised CPE required

## 2016-05-02 ENCOUNTER — Encounter: Payer: Self-pay | Admitting: Obstetrics and Gynecology

## 2016-05-02 ENCOUNTER — Ambulatory Visit (INDEPENDENT_AMBULATORY_CARE_PROVIDER_SITE_OTHER): Payer: Federal, State, Local not specified - PPO | Admitting: Obstetrics and Gynecology

## 2016-05-02 VITALS — BP 114/68 | Ht 67.5 in | Wt 280.0 lb

## 2016-05-02 DIAGNOSIS — Z6841 Body Mass Index (BMI) 40.0 and over, adult: Secondary | ICD-10-CM | POA: Diagnosis not present

## 2016-05-02 MED ORDER — PHENTERMINE HCL 37.5 MG PO TABS: 37.5000 mg | ORAL_TABLET | Freq: Every day | ORAL | 0 refills | Status: DC

## 2016-05-02 NOTE — Progress Notes (Signed)
Gynecology Office Visit  Chief Complaint:  Chief Complaint  Patient presents with  . Weight Loss    History of Present Illness: Patientis a 56 y.o. No obstetric history on file. female, who presents for the evaluation of weight gain. She has gained 9 pounds primarily over last 4 months. The patient states the following issues have contributed to her weight problem: physical inactivity, diet choices.  The patient has no additional symptoms. The patient specifically denies memory loss, muscle weakness, excessive thirst, and polyuria. Weight related co-morbidities include none. TShe has tried phentermine, belviq interventions in the past with limited success.   Review of Systems: 10 point review of systems negative unless otherwise noted in HPI  Past Medical History:  Past Medical History:  Diagnosis Date  . Abdominal or pelvic swelling, mass or lump, unspecified site   . Abnormal weight gain   . Acute upper respiratory infections of unspecified site   . Acute venous embolism and thrombosis of unspecified deep vessels of lower extremity   . Asymptomatic varicose veins   . Encounter for therapeutic drug monitoring   . Enlargement of lymph nodes   . Irritable bowel syndrome   . Long term (current) use of anticoagulants   . Other and unspecified special symptom or syndrome, not elsewhere classified   . Other congenital anomaly of toes   . Phlebitis and thrombophlebitis of superficial vessels of lower extremities   . Right leg DVT 09/27/09   anticoagulation with coumadin possibly until 3/12  . Strain of right Achilles tendon 8/11   per [facility] [facility]-no tear    Past Surgical History:  Past Surgical History:  Procedure Laterality Date  . ABDOMINAL HYSTERECTOMY    . EYE SURGERY Bilateral   . FLEXIBLE SIGMOIDOSCOPY  2003   Mild active colitis (Dr. Vira Agar)  . MASS ON OVARY    . NSVD     x2 (last 1991)    Gynecologic History: No LMP recorded. Patient has had a  hysterectomy.  Obstetric History: No obstetric history on file.  Family History:  Family History  Problem Relation Age of Onset  . Hypertension Father   . Coronary artery disease Father     s/p CABG and valve replacement after gallstone pancreatitis  . Atrial fibrillation Mother   . Deep vein thrombosis Mother   . Stomach cancer      GM    Social History:  Social History   Social History  . Marital status: Married    Spouse name: N/A  . Number of children: 2  . 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 Smoking status: Never Smoker  . Smokeless tobacco: Never Used  . Alcohol use No  . Drug use: No  . Sexual activity: Yes   Other Topics Concern  . Not on file   Social History Narrative   Remarried summer of 1997      2 daughters             Allergies:  Allergies  Allergen Reactions  . Neomycin-Bacitracin Zn-Polymyx Hives  . Tape Swelling and Rash    Rash, redness and swelling Redness, rash and swelling Rash, redness and swelling Rash, redness and swelling  . Neosporin Original [Bacitracin-Neomycin-Polymyxin] Hives  . Other Rash    Uncoded Allergy. Allergen: Tape, Other Reaction: skin rash, redness Uncoded Allergy. Allergen: Tape, Other Reaction: skin rash, redness  . Prednisone Itching  Medications: Prior to Admission medications   Medication Sig Start Date End Date Taking? Authorizing Provider  aspirin 81 MG tablet Take 81 mg by mouth daily.     Yes Historical Provider, MD  Docusate Calcium (STOOL SOFTENER PO) Take 1 tablet by mouth as needed.   Yes Historical Provider, MD  Multiple Vitamin (MULTIVITAMIN) tablet Take 1 tablet by mouth daily.   Yes Historical Provider, MD  acetaminophen (TYLENOL) 325 MG tablet Take 650 mg by mouth every 6 (six) hours as needed for mild pain.     Historical Provider, MD  chlorpheniramine-HYDROcodone (TUSSIONEX PENNKINETIC ER) 10-8 MG/5ML SUER  Take 5 mLs by mouth at bedtime as needed for cough. Patient not taking: Reported on 05/02/2016 05/24/15   Pleas Koch, NP  citalopram (CELEXA) 20 MG tablet TAKE 1 TABLET (20 MG TOTAL) BY MOUTH DAILY. Patient not taking: Reported on 05/02/2016 03/31/16   Tonia Ghent, MD  clobetasol cream (TEMOVATE) 0.98 % Apply 1 application topically 2 (two) times daily as needed (rash).  11/05/12   Tonia Ghent, MD  docusate sodium (COLACE) 100 MG capsule Take 100 mg by mouth daily.      Historical Provider, MD  ibuprofen (ADVIL,MOTRIN) 200 MG tablet Take 400 mg by mouth every 6 (six) hours as needed for moderate pain.     Historical Provider, MD  meclizine (ANTIVERT) 25 MG tablet Take 1 tablet (25 mg total) by mouth 3 (three) times daily as needed for dizziness. Patient not taking: Reported on 05/02/2016 02/17/13   Elyn Peers, MD  Naftifine HCl 2 % GEL APPLY TO AFFECTED AREA TWICE DAILY Patient not taking: Reported on 05/02/2016 08/28/14   Garrel Ridgel, DPM    Physical Exam Vitals:  Vitals:   05/02/16 1609  BP: 114/68   Filed Weights   05/02/16 1609  Weight: 280 lb (127 kg)  Body mass index is 43.21 kg/m.  No LMP recorded. Patient has had a hysterectomy.  General: NAD HEENT: normocephalic, anicteric Thyroid: no enlargement Pulmonary: no increased work of breathing Neurologic: Grossly intact Psychiatric: mood appropriate, affect full  Assessment: 56 y.o. No obstetric history on file. No problem-specific Assessment & Plan notes found for this encounter.   Plan: Problem List Items Addressed This Visit    None    Visit Diagnoses    Morbid obesity with BMI of 45.0-49.9, adult ([place])    -  Primary   Relevant Medications   phentermine (ADIPEX-P) 37.5 MG tablet   BMI 45.0-49.9, adult ([facility])       Relevant Medications   phentermine (ADIPEX-P) 37.5 MG tablet      1) 1500 Calorie ADA Diet  2) Patient education given regarding appropriate lifestyle changes for weight loss including: regular  physical activity, healthy coping strategies, caloric restriction and healthy eating patterns.  3) Patient will be started on weight loss medication. The risks and benefits and side effects of medication, such as Adipex (Phenteramine) ,  Tenuate (Diethylproprion), Belviq (lorcarsin), Contrave (buproprion/naltrexone), Qsymia (phentermine/topiramate), and Saxenda (liraglutide) is discussed. The pros and cons of suppressing appetite and boosting metabolism is discussed. Risks of tolerence and addiction is discussed for selected agents discussed. Use of medicine will ne short term, such as 3-4 months at a time followed by a period of time off of the medicine to avoid these risks and side effects for Adipex, Qsymia, and Tenuate discussed. Pt to call with any negative side effects and agrees to keep follow up appts.  4) Comorbidity Screening - hypothyroidism  screening, diabetes, and hyperlipidemia screening offered if not previously obtained  5) Encouraged weekly weight monitorig to track progress and sample 1 week food diary  6) Contraception - N/A postmenopausal patient  7) 15 minutes face-to-face; counseling/coordination of care > 50 percent of visit  8) Follow up in 4 weeks to assess response

## 2016-05-02 NOTE — Patient Instructions (Signed)
Calorie Counting for Weight Loss Calories are units of energy. Your body needs a certain amount of calories from food to keep you going throughout the day. When you eat more calories than your body needs, your body stores the extra calories as fat. When you eat fewer calories than your body needs, your body burns fat to get the energy it needs. Calorie counting means keeping track of how many calories you eat and drink each day. Calorie counting can be helpful if you need to lose weight. If you make sure to eat fewer calories than your body needs, you should lose weight. Ask your health care provider what a healthy weight is for you. For calorie counting to work, you will need to eat the right number of calories in a day in order to lose a healthy amount of weight per week. A dietitian can help you determine how many calories you need in a day and will give you suggestions on how to reach your calorie goal.  A healthy amount of weight to lose per week is usually 1-2 lb (0.5-0.9 kg). This usually means that your daily calorie intake should be reduced by 500-750 calories.  Eating 1,200 - 1,500 calories per day can help most women lose weight.  Eating 1,500 - 1,800 calories per day can help most men lose weight. What is my plan? My goal is to have __________ calories per day. If I have this many calories per day, I should lose around __________ pounds per week. What do I need to know about calorie counting? In order to meet your daily calorie goal, you will need to:  Find out how many calories are in each food you would like to eat. Try to do this before you eat.  Decide how much of the food you plan to eat.  Write down what you ate and how many calories it had. Doing this is called keeping a food log. To successfully lose weight, it is important to balance calorie counting with a healthy lifestyle that includes regular activity. Aim for 150 minutes of moderate exercise (such as walking) or 75  minutes of vigorous exercise (such as running) each week. Where do I find calorie information?   The number of calories in a food can be found on a Nutrition Facts label. If a food does not have a Nutrition Facts label, try to look up the calories online or ask your dietitian for help. Remember that calories are listed per serving. If you choose to have more than one serving of a food, you will have to multiply the calories per serving by the amount of servings you plan to eat. For example, the label on a package of bread might say that a serving size is 1 slice and that there are 90 calories in a serving. If you eat 1 slice, you will have eaten 90 calories. If you eat 2 slices, you will have eaten 180 calories. How do I keep a food log? Immediately after each meal, record the following information in your food log:  What you ate. Don't forget to include toppings, sauces, and other extras on the food.  How much you ate. This can be measured in cups, ounces, or number of items.  How many calories each food and drink had.  The total number of calories in the meal. Keep your food log near you, such as in a small notebook in your pocket, or use a mobile app or website. Some programs will   calculate calories for you and show you how many calories you have left for the day to meet your goal. What are some calorie counting tips?  Use your calories on foods and drinks that will fill you up and not leave you hungry:  Some examples of foods that fill you up are nuts and nut butters, vegetables, lean proteins, and high-fiber foods like whole grains. High-fiber foods are foods with more than 5 g fiber per serving.  Drinks such as sodas, specialty coffee drinks, alcohol, and juices have a lot of calories, yet do not fill you up.  Eat nutritious foods and avoid empty calories. Empty calories are calories you get from foods or beverages that do not have many vitamins or protein, such as candy, sweets, and  soda. It is better to have a nutritious high-calorie food (such as an avocado) than a food with few nutrients (such as a bag of chips).  Know how many calories are in the foods you eat most often. This will help you calculate calorie counts faster.  Pay attention to calories in drinks. Low-calorie drinks include water and unsweetened drinks.  Pay attention to nutrition labels for "low fat" or "fat free" foods. These foods sometimes have the same amount of calories or more calories than the full fat versions. They also often have added sugar, starch, or salt, to make up for flavor that was removed with the fat.  Find a way of tracking calories that works for you. Get creative. Try different apps or programs if writing down calories does not work for you. What are some portion control tips?  Know how many calories are in a serving. This will help you know how many servings of a certain food you can have.  Use a measuring cup to measure serving sizes. You could also try weighing out portions on a kitchen scale. With time, you will be able to estimate serving sizes for some foods.  Take some time to put servings of different foods on your favorite plates, bowls, and cups so you know what a serving looks like.  Try not to eat straight from a bag or box. Doing this can lead to overeating. Put the amount you would like to eat in a cup or on a plate to make sure you are eating the right portion.  Use smaller plates, glasses, and bowls to prevent overeating.  Try not to multitask (for example, watch TV or use your computer) while eating. If it is time to eat, sit down at a table and enjoy your food. This will help you to know when you are full. It will also help you to be aware of what you are eating and how much you are eating. What are tips for following this plan? Reading food labels   Check the calorie count compared to the serving size. The serving size may be smaller than what you are used to  eating.  Check the source of the calories. Make sure the food you are eating is high in vitamins and protein and low in saturated and trans fats. Shopping   Read nutrition labels while you shop. This will help you make healthy decisions before you decide to purchase your food.  Make a grocery list and stick to it. Cooking   Try to cook your favorite foods in a healthier way. For example, try baking instead of frying.  Use low-fat dairy products. Meal planning   Use more fruits and vegetables. Half of your   plate should be fruits and vegetables.  Include lean proteins like poultry and fish. How do I count calories when eating out?  Ask for smaller portion sizes.  Consider sharing an entree and sides instead of getting your own entree.  If you get your own entree, eat only half. Ask for a box at the beginning of your meal and put the rest of your entree in it so you are not tempted to eat it.  If calories are listed on the menu, choose the lower calorie options.  Choose dishes that include vegetables, fruits, whole grains, low-fat dairy products, and lean protein.  Choose items that are boiled, broiled, grilled, or steamed. Stay away from items that are buttered, battered, fried, or served with cream sauce. Items labeled "crispy" are usually fried, unless stated otherwise.  Choose water, low-fat milk, unsweetened iced tea, or other drinks without added sugar. If you want an alcoholic beverage, choose a lower calorie option such as a glass of wine or light beer.  Ask for dressings, sauces, and syrups on the side. These are usually high in calories, so you should limit the amount you eat.  If you want a salad, choose a garden salad and ask for grilled meats. Avoid extra toppings like bacon, cheese, or fried items. Ask for the dressing on the side, or ask for olive oil and vinegar or lemon to use as dressing.  Estimate how many servings of a food you are given. For example, a serving  of cooked rice is  cup or about the size of half a baseball. Knowing serving sizes will help you be aware of how much food you are eating at restaurants. The list below tells you how big or small some common portion sizes are based on everyday objects:  1 oz-4 stacked dice.  3 oz-1 deck of cards.  1 tsp-1 die.  1 Tbsp- a ping-pong ball.  2 Tbsp-1 ping-pong ball.   cup- baseball.  1 cup-1 baseball. Summary  Calorie counting means keeping track of how many calories you eat and drink each day. If you eat fewer calories than your body needs, you should lose weight.  A healthy amount of weight to lose per week is usually 1-2 lb (0.5-0.9 kg). This usually means reducing your daily calorie intake by 500-750 calories.  The number of calories in a food can be found on a Nutrition Facts label. If a food does not have a Nutrition Facts label, try to look up the calories online or ask your dietitian for help.  Use your calories on foods and drinks that will fill you up, and not on foods and drinks that will leave you hungry.  Use smaller plates, glasses, and bowls to prevent overeating. This information is not intended to replace advice given to you by your health care provider. Make sure you discuss any questions you have with your health care provider. Document Released: 01/13/2005 Document Revised: 12/14/2015 Document Reviewed: 12/14/2015 Elsevier Interactive Patient Education  2017 [employer]. Exercising to [employer] Exercising can help you to lose weight. In order to lose weight through exercise, you need to do vigorous-intensity exercise. You can tell that you are exercising with vigorous intensity if you are breathing very hard and fast and cannot hold a conversation while exercising. Moderate-intensity exercise helps to maintain your current weight. You can tell that you are exercising at a moderate level if you have a higher heart rate and faster breathing, but you are still able  to hold a conversation. How often should I exercise? Choose an activity that you enjoy and set realistic goals. Your health care provider can help you to make an activity plan that works for you. Exercise regularly as directed by your health care provider. This may include:  Doing resistance training twice each week, such as:  Push-ups.  Sit-ups.  Lifting weights.  Using resistance bands.  Doing a given intensity of exercise for a given amount of time. Choose from these options:  150 minutes of moderate-intensity exercise every week.  75 minutes of vigorous-intensity exercise every week.  A mix of moderate-intensity and vigorous-intensity exercise every week. Children, pregnant women, people who are out of shape, people who are overweight, and older adults may need to consult a health care provider for individual recommendations. If you have any sort of medical condition, be sure to consult your health care provider before starting a new exercise program. What are some activities that can help me to lose weight?  Walking at a rate of at least 4.5 miles an hour.  Jogging or running at a rate of 5 miles per hour.  Biking at a rate of at least 10 miles per hour.  Lap swimming.  Roller-skating or in-line skating.  Cross-country skiing.  Vigorous competitive sports, such as football, basketball, and soccer.  Jumping rope.  Aerobic dancing. How can I be more active in my day-to-day activities?  Use the stairs instead of the elevator.  Take a walk during your lunch break.  If you drive, park your car farther away from work or school.  If you take public transportation, get off one stop early and walk the rest of the way.  Make all of your phone calls while standing up and walking around.  Get up, stretch, and walk around every 30 minutes throughout the day. What guidelines should I follow while exercising?  Do not exercise so much that you hurt yourself, feel dizzy,  or get very short of breath.  Consult your health care provider prior to starting a new exercise program.  Wear comfortable clothes and shoes with good support.  Drink plenty of water while you exercise to prevent dehydration or heat stroke. Body water is lost during exercise and must be replaced.  Work out until you breathe faster and your heart beats faster. This information is not intended to replace advice given to you by your health care provider. Make sure you discuss any questions you have with your health care provider. Document Released: 02/15/2010 Document Revised: 06/21/2015 Document Reviewed: 06/16/2013 Elsevier Interactive Patient Education  2017 [employer].

## 2016-05-20 DIAGNOSIS — Z1231 Encounter for screening mammogram for malignant neoplasm of breast: Secondary | ICD-10-CM | POA: Diagnosis not present

## 2016-05-29 ENCOUNTER — Ambulatory Visit (INDEPENDENT_AMBULATORY_CARE_PROVIDER_SITE_OTHER): Payer: Federal, State, Local not specified - PPO | Admitting: Obstetrics and Gynecology

## 2016-05-29 ENCOUNTER — Encounter: Payer: Self-pay | Admitting: Obstetrics and Gynecology

## 2016-05-29 DIAGNOSIS — Z6841 Body Mass Index (BMI) 40.0 and over, adult: Secondary | ICD-10-CM | POA: Diagnosis not present

## 2016-05-29 MED ORDER — PHENTERMINE HCL 37.5 MG PO TABS: 37.5000 mg | ORAL_TABLET | Freq: Every day | ORAL | 0 refills | Status: DC

## 2016-05-29 NOTE — Progress Notes (Signed)
Gynecology Office Visit  Chief Complaint:  Chief Complaint  Patient presents with  . Weight Check    History of Present Illness: Patientis a 56 y.o. No obstetric history on file. female, who presents for the evaluation of the desire to lose weight. She has lost 11 pounds. The patient states the following symptoms since starting her weight loss therapy: appetite suppression, energy, and weight loss.  The patient also reports no other ill effects. The patient specifically denies heart palpitations, anxiety, and insomnia.    Review of Systems: 10 point review of systems negative unless otherwise noted in HPI  Past Medical History:  Past Medical History:  Diagnosis Date  . Abdominal or pelvic swelling, mass or lump, unspecified site   . Abnormal weight gain   . Acute upper respiratory infections of unspecified site   . Acute venous embolism and thrombosis of unspecified deep vessels of lower extremity   . Asymptomatic varicose veins   . Encounter for therapeutic drug monitoring   . Enlargement of lymph nodes   . Irritable bowel syndrome   . Long term (current) use of anticoagulants   . Other and unspecified special symptom or syndrome, not elsewhere classified   . Other congenital anomaly of toes   . Phlebitis and thrombophlebitis of superficial vessels of lower extremities   . Right leg DVT 09/27/09   anticoagulation with coumadin possibly until 3/12  . Strain of right Achilles tendon 8/11   per [facility] [facility]-no tear    Past Surgical History:  Past Surgical History:  Procedure Laterality Date  . ABDOMINAL HYSTERECTOMY    . EYE SURGERY Bilateral   . FLEXIBLE SIGMOIDOSCOPY  2003   Mild active colitis (Dr. Vira Agar)  . MASS ON OVARY    . NSVD     x2 (last 1991)    Gynecologic History: No LMP recorded. Patient has had a hysterectomy.  Obstetric History: No obstetric history on file.  Family History:  Family History  Problem Relation Age of Onset  . Hypertension Father     . Coronary artery disease Father     s/p CABG and valve replacement after gallstone pancreatitis  . Atrial fibrillation Mother   . Deep vein thrombosis Mother   . Stomach cancer      GM    Social History:  Social History   Social History  . Marital status: Married    Spouse name: N/A  . Number of children: 2  . 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 Smoking status: Never Smoker  . Smokeless tobacco: Never Used  . Alcohol use No  . Drug use: No  . Sexual activity: Yes   Other Topics Concern  . Not on file   Social History Narrative   Remarried summer of 1997      2 daughters             Allergies:  Allergies  Allergen Reactions  . Neomycin-Bacitracin Zn-Polymyx Hives  . Tape Swelling and Rash    Rash, redness and swelling Redness, rash and swelling Rash, redness and swelling Rash, redness and swelling  . Neosporin Original [Bacitracin-Neomycin-Polymyxin] Hives  . Other Rash    Uncoded Allergy. Allergen: Tape, Other Reaction: skin rash, redness Uncoded Allergy. Allergen: Tape, Other Reaction: skin rash, redness  . Prednisone Itching    Medications: Prior to Admission medications   Medication Sig Start Date End Date Taking?  Authorizing Provider  acetaminophen (TYLENOL) 325 MG tablet Take 650 mg by mouth every 6 (six) hours as needed for mild pain.     Historical Provider, MD  aspirin 81 MG tablet Take 81 mg by mouth daily.      Historical Provider, MD  chlorpheniramine-HYDROcodone (TUSSIONEX PENNKINETIC ER) 10-8 MG/5ML SUER Take 5 mLs by mouth at bedtime as needed for cough. Patient not taking: Reported on 05/02/2016 05/24/15   Pleas Koch, NP  citalopram (CELEXA) 20 MG tablet TAKE 1 TABLET (20 MG TOTAL) BY MOUTH DAILY. Patient not taking: Reported on 05/02/2016 03/31/16   Tonia Ghent, MD  clobetasol cream (TEMOVATE) 2.77 % Apply 1 application topically 2 (two) times daily as  needed (rash).  11/05/12   Tonia Ghent, MD  Docusate Calcium (STOOL SOFTENER PO) Take 1 tablet by mouth as needed.    Historical Provider, MD  docusate sodium (COLACE) 100 MG capsule Take 100 mg by mouth daily.      Historical Provider, MD  ibuprofen (ADVIL,MOTRIN) 200 MG tablet Take 400 mg by mouth every 6 (six) hours as needed for moderate pain.     Historical Provider, MD  meclizine (ANTIVERT) 25 MG tablet Take 1 tablet (25 mg total) by mouth 3 (three) times daily as needed for dizziness. Patient not taking: Reported on 05/02/2016 02/17/13   Elyn Peers, MD  Multiple Vitamin (MULTIVITAMIN) tablet Take 1 tablet by mouth daily.    Historical Provider, MD  Naftifine HCl 2 % GEL APPLY TO AFFECTED AREA TWICE DAILY Patient not taking: Reported on 05/02/2016 08/28/14   Max T Hyatt, DPM  phentermine (ADIPEX-P) 37.5 MG tablet Take 1 tablet (37.5 mg total) by mouth daily before breakfast. 05/02/16   Malachy Mood, MD    Physical Exam Vitals:  Vitals:   05/29/16 1624  BP: 122/80   No LMP recorded. Patient has had a hysterectomy. Filed Weights   05/29/16 1624  Weight: 269 lb (122 kg)   Body mass index is 41.51 kg/m.  General: NAD HEENT: normocephalic, anicteric Thyroid: no enlargement Pulmonary: no increased work of breathing Neurologic: Grossly intact Psychiatric: mood appropriate, affect full  Assessment: 56 y.o. weight loss follow up  Plan: Problem List Items Addressed This Visit    None    Visit Diagnoses    Morbid obesity ([place])    -  Primary   Relevant Medications   phentermine (ADIPEX-P) 37.5 MG tablet   BMI 40.0-44.9, adult ([facility])       Relevant Medications   phentermine (ADIPEX-P) 37.5 MG tablet      1) 1200-1300 Calorie ADA Diet  2) Patient education given regarding appropriate lifestyle changes for weight loss including: regular physical activity, healthy coping strategies, caloric restriction and healthy eating patterns.  3) Patient will be started on weight loss  medication. The risks and benefits and side effects of medication, such as Adipex (Phenteramine) ,  Tenuate (Diethylproprion), Belviq (lorcarsin), Contrave (buproprion/naltrexone), Qsymia (phentermine/topiramate), and Saxenda (liraglutide) is discussed. The pros and cons of suppressing appetite and boosting metabolism is discussed. Risks of tolerence and addiction is discussed for selected agents discussed. Use of medicine will ne short term, such as 3-4 months at a time followed by a period of time off of the medicine to avoid these risks and side effects for Adipex, Qsymia, and Tenuate discussed. Pt to call with any negative side effects and agrees to keep follow up appts.  4) Patient to take medication, with the benefits of appetite suppression and metabolism  boost d/w pt, along with the side effects and risk factors of long term use that will be avoided with our use of short bursts of therapy. Rx provided.    5) 15 minutes face-to-face; with counseling/coordination of care > 50 percent of visit related to obesity and ongoing management/treatment   6) Follow up in 4 weeks to assess response

## 2016-06-27 ENCOUNTER — Ambulatory Visit: Payer: Federal, State, Local not specified - PPO | Admitting: Obstetrics and Gynecology

## 2016-07-18 ENCOUNTER — Ambulatory Visit: Payer: Federal, State, Local not specified - PPO | Admitting: Obstetrics and Gynecology

## 2016-08-08 ENCOUNTER — Ambulatory Visit: Payer: Federal, State, Local not specified - PPO | Admitting: Obstetrics and Gynecology

## 2016-08-14 ENCOUNTER — Ambulatory Visit (INDEPENDENT_AMBULATORY_CARE_PROVIDER_SITE_OTHER): Payer: Federal, State, Local not specified - PPO | Admitting: Obstetrics and Gynecology

## 2016-08-14 ENCOUNTER — Encounter: Payer: Self-pay | Admitting: Obstetrics and Gynecology

## 2016-08-14 DIAGNOSIS — Z6841 Body Mass Index (BMI) 40.0 and over, adult: Secondary | ICD-10-CM | POA: Diagnosis not present

## 2016-08-14 MED ORDER — PHENTERMINE HCL 37.5 MG PO TABS
37.5000 mg | ORAL_TABLET | Freq: Every day | ORAL | 0 refills | Status: DC
Start: 2016-08-14 — End: 2016-10-03

## 2016-08-14 NOTE — Progress Notes (Signed)
Gynecology Office Visit  Chief Complaint:  Chief Complaint  Patient presents with  . Weight Check    History of Present Illness: Patientis a 56 y.o. No obstetric history on file. female, who presents for the evaluation of the desire to lose weight. She has lost 4 pounds, 16lbs in 2 months. The patient states the following symptoms since starting her weight loss therapy: appetite suppression, energy, and weight loss.  The patient also reports no other ill effects. The patient specifically denies heart palpitations, anxiety, and insomnia.    Review of Systems: 10 point review of systems negative unless otherwise noted in HPI  Past Medical History:  Past Medical History:  Diagnosis Date  . Abdominal or pelvic swelling, mass or lump, unspecified site   . Abnormal weight gain   . Acute upper respiratory infections of unspecified site   . Acute venous embolism and thrombosis of unspecified deep vessels of lower extremity   . Asymptomatic varicose veins   . Encounter for therapeutic drug monitoring   . Enlargement of lymph nodes   . Irritable bowel syndrome   . Long term (current) use of anticoagulants   . Other and unspecified special symptom or syndrome, not elsewhere classified   . Other congenital anomaly of toes   . Phlebitis and thrombophlebitis of superficial vessels of lower extremities   . Right leg DVT 09/27/09   anticoagulation with coumadin possibly until 3/12  . Strain of right Achilles tendon 8/11   per [facility] [facility]-no tear    Past Surgical History:  Past Surgical History:  Procedure Laterality Date  . ABDOMINAL HYSTERECTOMY    . EYE SURGERY Bilateral   . FLEXIBLE SIGMOIDOSCOPY  2003   Mild active colitis (Dr. Vira Agar)  . MASS ON OVARY    . NSVD     x2 (last 1991)    Gynecologic History: No LMP recorded. Patient has had a hysterectomy.  Obstetric History: No obstetric history on file.  Family History:  Family History  Problem Relation Age of Onset  .  Hypertension Father   . Coronary artery disease Father        s/p CABG and valve replacement after gallstone pancreatitis  . Atrial fibrillation Mother   . Deep vein thrombosis Mother   . Stomach cancer Unknown        GM    Social History:  Social History   Social History  . Marital status: Married    Spouse name: N/A  . Number of children: 2  . 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 Smoking status: Never Smoker  . Smokeless tobacco: Never Used  . Alcohol use No  . Drug use: No  . Sexual activity: Yes   Other Topics Concern  . Not on file   Social History Narrative   Remarried summer of 1997      2 daughters             Allergies:  Allergies  Allergen Reactions  . Neomycin-Bacitracin Zn-Polymyx Hives  . Tape Swelling and Rash    Rash, redness and swelling Redness, rash and swelling Rash, redness and swelling Rash, redness and swelling  . Neosporin Original [Bacitracin-Neomycin-Polymyxin] Hives  . Other Rash    Uncoded Allergy. Allergen: Tape, Other Reaction: skin rash, redness Uncoded Allergy. Allergen: Tape, Other Reaction: skin rash, redness  . Prednisone Itching    Medications: Prior to Admission medications  Medication Sig Start Date End Date Taking? Authorizing Provider  acetaminophen (TYLENOL) 325 MG tablet Take 650 mg by mouth every 6 (six) hours as needed for mild pain.    Yes [provider]  aspirin 81 MG tablet Take 81 mg by mouth daily.     Yes [provider]  chlorpheniramine-HYDROcodone (TUSSIONEX PENNKINETIC ER) 10-8 MG/5ML SUER Take 5 mLs by mouth at bedtime as needed for cough. 05/24/15  Yes Pleas Koch, NP  citalopram (CELEXA) 20 MG tablet TAKE 1 TABLET (20 MG TOTAL) BY MOUTH DAILY. 03/31/16  Yes Tonia Ghent, MD  docusate sodium (COLACE) 100 MG capsule Take 100 mg by mouth daily.     Yes [provider]  ibuprofen  (ADVIL,MOTRIN) 200 MG tablet Take 400 mg by mouth every 6 (six) hours as needed for moderate pain.    Yes [provider]  Multiple Vitamin (MULTIVITAMIN) tablet Take 1 tablet by mouth daily.   Yes [provider]  phentermine (ADIPEX-P) 37.5 MG tablet Take 1 tablet (37.5 mg total) by mouth daily before breakfast. 05/29/16  Yes Malachy Mood, MD    Physical Exam Blood pressure 112/66, pulse 63, height 5' 7.5" (1.715 m), weight 265 lb (120.2 kg). Body mass index is 40.89 kg/m.  General: NAD HEENT: normocephalic, anicteric Thyroid: no enlargement Pulmonary: no increased work of breathing Neurologic: Grossly intact Psychiatric: mood appropriate, affect full  Assessment: 56 y.o. medical weight loss follow up  Plan: Problem List Items Addressed This Visit    None    Visit Diagnoses    Morbid obesity ([place])    -  Primary   BMI 40.0-44.9, adult ([place])          1) 1500 Calorie ADA Diet  2) Patient education given regarding appropriate lifestyle changes for weight loss including: regular physical activity, healthy coping strategies, caloric restriction and healthy eating patterns.  3) Patient will be started on weight loss medication. The risks and benefits and side effects of medication, such as Adipex (Phenteramine) ,  Tenuate (Diethylproprion), Belviq (lorcarsin), Contrave (buproprion/naltrexone), Qsymia (phentermine/topiramate), and Saxenda (liraglutide) is discussed. The pros and cons of suppressing appetite and boosting metabolism is discussed. Risks of tolerence and addiction is discussed for selected agents discussed. Use of medicine will ne short term, such as 3-4 months at a time followed by a period of time off of the medicine to avoid these risks and side effects for Adipex, Qsymia, and Tenuate discussed. Pt to call with any negative side effects and agrees to keep follow up appts.  4) Patient to take medication, with the benefits of appetite suppression and  metabolism boost d/w pt, along with the side effects and risk factors of long term use that will be avoided with our use of short bursts of therapy. Rx provided.    5) 15 minutes face-to-face; with counseling/coordination of care > 50 percent of visit related to obesity and ongoing management/treatment   6) Follow up in 4 weeks to assess response

## 2016-09-01 ENCOUNTER — Ambulatory Visit (INDEPENDENT_AMBULATORY_CARE_PROVIDER_SITE_OTHER): Payer: Federal, State, Local not specified - PPO | Admitting: Podiatry

## 2016-09-01 ENCOUNTER — Encounter: Payer: Self-pay | Admitting: Podiatry

## 2016-09-01 DIAGNOSIS — S91301A Unspecified open wound, right foot, initial encounter: Secondary | ICD-10-CM | POA: Diagnosis not present

## 2016-09-01 NOTE — Progress Notes (Signed)
She presents today states that she has a small scabbed area this been there for about 2 weeks on the medial aspect of her right foot she states this seems to be doing a little bit better but it has been irritated recently. She states that she actually started picking at it the other night and feels that she may have gotten something out of it. [place] and grams are horses.  Objective: Vital signs are stable she is alert and oriented 3 pulses are palpable. Very small scabbed lesion demonstrated no erythema no cellulitis drainage or odor no purulence I removed the scab I saw no foreign object I palpated no foreign object and again no sign of infection.  Assessment: Probable foreign body irritation removed by the patient.  Plan: I recommended that if this does not go on to heal uneventfully in the next 2-3 weeks that she is to follow-up with [place] for a punch biopsy.

## 2016-09-09 DIAGNOSIS — G5602 Carpal tunnel syndrome, left upper limb: Secondary | ICD-10-CM | POA: Diagnosis not present

## 2016-09-09 DIAGNOSIS — M67431 Ganglion, right wrist: Secondary | ICD-10-CM | POA: Diagnosis not present

## 2016-09-10 ENCOUNTER — Ambulatory Visit: Payer: Federal, State, Local not specified - PPO | Admitting: Obstetrics and Gynecology

## 2016-10-03 ENCOUNTER — Encounter: Payer: Self-pay | Admitting: Obstetrics and Gynecology

## 2016-10-03 ENCOUNTER — Ambulatory Visit (INDEPENDENT_AMBULATORY_CARE_PROVIDER_SITE_OTHER): Payer: Federal, State, Local not specified - PPO | Admitting: Obstetrics and Gynecology

## 2016-10-03 VITALS — BP 120/72 | Ht 67.5 in | Wt 263.0 lb

## 2016-10-03 DIAGNOSIS — E669 Obesity, unspecified: Secondary | ICD-10-CM

## 2016-10-03 DIAGNOSIS — Z6841 Body Mass Index (BMI) 40.0 and over, adult: Secondary | ICD-10-CM | POA: Diagnosis not present

## 2016-10-03 DIAGNOSIS — IMO0001 Reserved for inherently not codable concepts without codable children: Secondary | ICD-10-CM

## 2016-10-03 MED ORDER — PHENTERMINE HCL 37.5 MG PO TABS: 37.5000 mg | ORAL_TABLET | Freq: Every day | ORAL | 0 refills | Status: DC

## 2016-10-03 NOTE — Progress Notes (Signed)
Gynecology Office Visit  Chief Complaint:  Chief Complaint  Patient presents with  . Weight Check    History of Present Illness: Patientis a 56 y.o. No obstetric history on file. female, who presents for the evaluation of the desire to lose weight. She has lost 2 pounds for a 3 month weight loss of 18lbs The patient states the following symptoms since starting her weight loss therapy: appetite suppression, energy, and weight loss.  The patient also reports no other ill effects. The patient specifically denies heart palpitations, anxiety, and insomnia.    Review of Systems: 10 point review of systems negative unless otherwise noted in HPI  Past Medical History:  Past Medical History:  Diagnosis Date  . Abdominal or pelvic swelling, mass or lump, unspecified site   . Abnormal weight gain   . Acute upper respiratory infections of unspecified site   . Acute venous embolism and thrombosis of unspecified deep vessels of lower extremity   . Asymptomatic varicose veins   . Encounter for therapeutic drug monitoring   . Enlargement of lymph nodes   . Irritable bowel syndrome   . Long term (current) use of anticoagulants   . Other and unspecified special symptom or syndrome, not elsewhere classified   . Other congenital anomaly of toes   . Phlebitis and thrombophlebitis of superficial vessels of lower extremities   . Right leg DVT 09/27/09   anticoagulation with coumadin possibly until 3/12  . Strain of right Achilles tendon 8/11   per [facility]-no tear    Past Surgical History:  Past Surgical History:  Procedure Laterality Date  . ABDOMINAL HYSTERECTOMY    . EYE SURGERY Bilateral   . FLEXIBLE SIGMOIDOSCOPY  2003   Mild active colitis (Dr. Vira Agar)  . MASS ON OVARY    . NSVD     x2 (last 1991)    Gynecologic History: No LMP recorded. Patient has had a hysterectomy.  Obstetric History: No obstetric history on file.  Family History:  Family History  Problem Relation Age  of Onset  . Hypertension Father   . Coronary artery disease Father        s/p CABG and valve replacement after gallstone pancreatitis  . Atrial fibrillation Mother   . Deep vein thrombosis Mother   . Stomach cancer Unknown        GM    Social History:  Social History   Social History  . Marital status: Married    Spouse name: N/A  . Number of children: 2  . 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 Smoking status: Never Smoker  . Smokeless tobacco: Never Used  . Alcohol use No  . Drug use: No  . Sexual activity: Yes   Other Topics Concern  . Not on file   Social History Narrative   Remarried summer of 1997      2 daughters             Allergies:  Allergies  Allergen Reactions  . Neomycin-Bacitracin Zn-Polymyx Hives  . Tape Swelling and Rash    Rash, redness and swelling Redness, rash and swelling Rash, redness and swelling Rash, redness and swelling  . Neosporin Original [Bacitracin-Neomycin-Polymyxin] Hives  . Other Rash    Uncoded Allergy. Allergen: Tape, Other Reaction: skin rash, redness Uncoded Allergy. Allergen: Tape, Other Reaction: skin rash, redness  . Prednisone Itching    Medications:  Prior to Admission medications   Medication Sig Start Date End Date Taking? Authorizing Provider  acetaminophen (TYLENOL) 325 MG tablet Take 650 mg by mouth every 6 (six) hours as needed for mild pain.    Yes [provider]  aspirin 81 MG tablet Take 81 mg by mouth daily.     Yes [provider]  chlorpheniramine-HYDROcodone (TUSSIONEX PENNKINETIC ER) 10-8 MG/5ML SUER Take 5 mLs by mouth at bedtime as needed for cough. 05/24/15  Yes Pleas Koch, NP  citalopram (CELEXA) 20 MG tablet TAKE 1 TABLET (20 MG TOTAL) BY MOUTH DAILY. 03/31/16  Yes Tonia Ghent, MD  docusate sodium (COLACE) 100 MG capsule Take 100 mg by mouth daily.     Yes [provider]    ibuprofen (ADVIL,MOTRIN) 200 MG tablet Take 400 mg by mouth every 6 (six) hours as needed for moderate pain.    Yes [provider]  Multiple Vitamin (MULTIVITAMIN) tablet Take 1 tablet by mouth daily.   Yes [provider]  phentermine (ADIPEX-P) 37.5 MG tablet Take 1 tablet (37.5 mg total) by mouth daily before breakfast. 08/14/16  Yes Malachy Mood, MD    Physical Exam Blood pressure 120/72, height 5' 7.5" (1.715 m), weight 263 lb (119.3 kg). -2lbs  Body mass index is 40.58 kg/m.   General: NAD HEENT: normocephalic, anicteric Thyroid: no enlargement Pulmonary: no increased work of breathing Neurologic: Grossly intact Psychiatric: mood appropriate, affect full  Assessment: 56 y.o. medical weight loss follow up  Plan: Problem List Items Addressed This Visit    None    Visit Diagnoses    Class 3 obesity without serious comorbidity with body mass index (BMI) of 40.0 to 44.9 in adult, unspecified obesity type ([place])    -  Primary   Relevant Medications   phentermine (ADIPEX-P) 37.5 MG tablet      1) 1500 Calorie ADA Diet  2) Patient education given regarding appropriate lifestyle changes for weight loss including: regular physical activity, healthy coping strategies, caloric restriction and healthy eating patterns.  3) Patient will be started on weight loss medication. The risks and benefits and side effects of medication, such as Adipex (Phenteramine) ,  Tenuate (Diethylproprion), Belviq (lorcarsin), Contrave (buproprion/naltrexone), Qsymia (phentermine/topiramate), and Saxenda (liraglutide) is discussed. The pros and cons of suppressing appetite and boosting metabolism is discussed. Risks of tolerence and addiction is discussed for selected agents discussed. Use of medicine will ne short term, such as 3-4 months at a time followed by a period of time off of the medicine to avoid these risks and side effects for Adipex, Qsymia, and Tenuate discussed. Pt to  call with any negative side effects and agrees to keep follow up appts.  4) Patient to take medication, with the benefits of appetite suppression and metabolism boost d/w pt, along with the side effects and risk factors of long term use that will be avoided with our use of short bursts of therapy. Rx provided.    5) 15 minutes face-to-face; with counseling/coordination of care > 50 percent of visit related to obesity and ongoing management/treatment   6) Follow up in 4 weeks to assess response

## 2016-11-28 ENCOUNTER — Encounter: Payer: Self-pay | Admitting: Family Medicine

## 2016-11-28 ENCOUNTER — Ambulatory Visit (INDEPENDENT_AMBULATORY_CARE_PROVIDER_SITE_OTHER): Payer: Federal, State, Local not specified - PPO | Admitting: Family Medicine

## 2016-11-28 VITALS — BP 122/80 | HR 72 | Temp 98.0°F | Wt 264.5 lb

## 2016-11-28 DIAGNOSIS — R4586 Emotional lability: Secondary | ICD-10-CM

## 2016-11-28 DIAGNOSIS — Z0000 Encounter for general adult medical examination without abnormal findings: Secondary | ICD-10-CM

## 2016-11-28 DIAGNOSIS — Z1211 Encounter for screening for malignant neoplasm of colon: Secondary | ICD-10-CM

## 2016-11-28 MED ORDER — CITALOPRAM HYDROBROMIDE 20 MG PO TABS: ORAL_TABLET | ORAL | 3 refills | Status: DC

## 2016-11-28 NOTE — Patient Instructions (Addendum)
Please get me a copy of your labs that were done at work.  Check with your insurance to see if they will cover the shingrix shot. Go to the lab on the way out.  We'll contact you with your lab report. Take care.  Glad to see you.

## 2016-11-28 NOTE — Progress Notes (Signed)
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  She is trying to adjust to that.  Discussed.   She is working on weight loss.  Discussed.  She is on phentermine per outside MD.  BP is okay today.  I'll defer to outside clinic.    Pap and mammogram done per [facility].   Requesting records.  She has blood work done at work.  See AVS.   Tetanus shot done at work, done <10 years ago.   She got a pneumonia shot in her 64s.  She wouldn't need f/u vaccine at this point.   dw pt.   Flu shot to be done at work.    Mood d/w pt.  "it (citalpram) works.  I'm much better taking it."  She didn't want to quit.  No ADE on med. No SI/HI.  She failed trial off med prior.  D/w pt.    PMH and SH reviewed  ROS: Per HPI unless specifically indicated in ROS section   Meds, vitals, and allergies reviewed.   GEN: nad, alert and oriented HEENT: mucous membranes moist NECK: supple w/o LA CV: rrr PULM: ctab, no inc wob ABD: soft, +bs EXT: no edema SKIN: no acute rash

## 2016-11-30 DIAGNOSIS — Z0000 Encounter for general adult medical examination without abnormal findings: Secondary | ICD-10-CM | POA: Insufficient documentation

## 2016-11-30 DIAGNOSIS — R4586 Emotional lability: Secondary | ICD-10-CM | POA: Insufficient documentation

## 2016-11-30 NOTE — Assessment & Plan Note (Signed)
Pap and mammogram done per [facility].   Requesting records.  She has blood work done at work.  See AVS.   Tetanus shot done at work, done <10 years ago.   She got a pneumonia shot in her 92s.  She wouldn't need f/u vaccine at this point.   dw pt.   Flu shot to be done at work.   D/w patient [id number] for colon cancer screening, including IFOB vs. colonoscopy.  Risks and benefits of both were discussed and patient voiced understanding.  Pt elects EFE:OFHQ.

## 2016-11-30 NOTE — Assessment & Plan Note (Signed)
She didn't want to quit/change med.  No ADE on med. No SI/HI.  She failed trial off med prior.  D/w pt.  Continue as is.  She agrees.

## 2016-12-30 ENCOUNTER — Encounter: Payer: Self-pay | Admitting: Family Medicine

## 2016-12-31 DIAGNOSIS — K080 Exfoliation of teeth due to systemic causes: Secondary | ICD-10-CM | POA: Diagnosis not present

## 2017-01-22 ENCOUNTER — Encounter: Payer: Self-pay | Admitting: Certified Nurse Midwife

## 2017-01-22 ENCOUNTER — Ambulatory Visit (INDEPENDENT_AMBULATORY_CARE_PROVIDER_SITE_OTHER): Payer: Federal, State, Local not specified - PPO | Admitting: Certified Nurse Midwife

## 2017-01-22 VITALS — BP 130/80 | HR 68 | Ht 67.5 in | Wt 266.0 lb

## 2017-01-22 DIAGNOSIS — Z1231 Encounter for screening mammogram for malignant neoplasm of breast: Secondary | ICD-10-CM | POA: Diagnosis not present

## 2017-01-22 DIAGNOSIS — Z1239 Encounter for other screening for malignant neoplasm of breast: Secondary | ICD-10-CM

## 2017-01-22 DIAGNOSIS — Z01419 Encounter for gynecological examination (general) (routine) without abnormal findings: Secondary | ICD-10-CM

## 2017-01-22 DIAGNOSIS — Z86718 Personal history of other venous thrombosis and embolism: Secondary | ICD-10-CM | POA: Insufficient documentation

## 2017-01-22 DIAGNOSIS — Z1211 Encounter for screening for malignant neoplasm of colon: Secondary | ICD-10-CM

## 2017-01-22 DIAGNOSIS — C4491 Basal cell carcinoma of skin, unspecified: Secondary | ICD-10-CM | POA: Insufficient documentation

## 2017-01-22 LAB — HEMOCCULT GUIAC POC 1CARD (OFFICE): FECAL OCCULT BLD: NEGATIVE

## 2017-01-22 NOTE — Progress Notes (Signed)
Gynecology Annual Exam  PCP: Tonia Ghent, MD  Chief Complaint:  Chief Complaint  Patient presents with  . Gynecologic Exam    History of Present Illness:Joann Mendez presents today for her annual exam. She is a 56 year old Caucasian/White female , G 2 P 2 0 0 2 , s/p TAH/BSO . She is having no significant GYN problems.   She has had no spotting vaginally.   The patient's past medical history is notable for a history of a TAH/BSO for a mature cystic teratoma, anxiety, h/o superficial thrombophlebitis and DVT, BCC requiring MOHs surgery on her nose, and obesity..  Since her last annual GYN exam dated 11/16/2015 , she has continued to lose weight weight on Adipex over the last 3 years. She is being followed by Dr Georgianne Fick. She is currently on a drug free interval. She is dealing with "empty nest" syndrome, as her youngest daughter has moved out of the house. She is not sexually active as both her and her husband have low libido (Husband has low testosterone.)   Her most recent pap smear was obtained 11/16/2015 and was NIL  Her most recent mammogram obtained on 05/21/2016 was normal. There is a positive history of breast cancer in her cousin. Genetic testing has not been done. There is no family history of ovarian cancer. The patient does not  do occ self breast exams.  She denies a recent screening colonoscopy and is eligible. She had a negative hemoccult last year  She denies a recent DEXA scan. The patient does not smoke.  The patient does not drink alcohol.  The patient does not use illegal drugs.  The patient exercises regularly by walking 30 minutes at lunch 3-4x/day. 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  The patient does not get adequate calcium in her diet. Has been having some lactose intolerance sx.  She had a recent cholesterol screen in 2018 by PCP Dr Damita Dunnings at [facility] that was normal.      Review of Systems: Review of Systems  Constitutional: Positive  for weight loss. Negative for chills and fever.  HENT: Negative for congestion, sinus pain and sore throat.   Eyes: Negative for blurred vision and pain.  Respiratory: Negative for hemoptysis, shortness of breath and wheezing.   Cardiovascular: Negative for chest pain, palpitations and leg swelling.  Gastrointestinal: Negative for abdominal pain, blood in stool, diarrhea, heartburn, nausea and vomiting.  Genitourinary: Negative for dysuria, frequency, hematuria and urgency.  Musculoskeletal: Negative for back pain, joint pain and myalgias.  Skin: Negative for itching and rash.  Neurological: Negative for dizziness, tingling and headaches.  Endo/Heme/Allergies: Negative for environmental allergies and polydipsia. Does not bruise/bleed easily.       Negative for hirsutism   Psychiatric/Behavioral: Negative for depression. The patient is not nervous/anxious and does not have insomnia.     Past Medical History:  Past Medical History:  Diagnosis Date  . Abdominal or pelvic swelling, mass or lump, unspecified site   . Abnormal weight gain   . Acute upper respiratory infections of unspecified site   . Acute venous embolism and thrombosis of unspecified deep vessels of lower extremity   . Anxiety   . Asymptomatic varicose veins   . Basal cell carcinoma 02/2011   nose  . Encounter for therapeutic drug monitoring   . Enlargement of lymph nodes   . Irritable bowel syndrome   . Long term (current) use of anticoagulants   .  Mature cystic teratoma   . Morbid obesity with BMI of 40.0-44.9, adult ([place])   . Neuropathy    pinched nerve left hip/leg  . Obesity   . Other and unspecified special symptom or syndrome, not elsewhere classified   . Other congenital anomaly of toes   . Phlebitis and thrombophlebitis of superficial vessels of lower extremities   . Right leg DVT 09/27/09   anticoagulation with coumadin possibly until 3/12  . Strain of right Achilles tendon 8/11   per [place] [facility]-no tear     Past Surgical History:  Past Surgical History:  Procedure Laterality Date  . CATARACT EXTRACTION, BILATERAL  06/2008  . FLEXIBLE SIGMOIDOSCOPY  2003   Mild active colitis (Dr. Vira Agar)  . MOHS SURGERY  02/2011   MOH's procedure for skin cancer on nose at [facility]  . NSVD     x2 (last 1991)  . PRE-MALIGNANT / BENIGN SKIN LESION EXCISION     left temporal area  . TOTAL ABDOMINAL HYSTERECTOMY W/ BILATERAL SALPINGOOPHORECTOMY  12/2007   TAH-BSO mature cystic teratoma  . VARICOSE VEIN SURGERY     sclerotherapy    Family History:  Family History  Problem Relation Age of Onset  . Coronary artery disease Father        s/p CABG and valve replacement and repair of heart aneurysm 2012  . Heart disease Father   . Stroke Father        Had sepsis from liver abscess  . Atrial fibrillation Mother   . Deep vein thrombosis Mother   . Clotting disorder Mother   . Hypertension Mother   . Breast cancer Cousin 40       maternal  . Stomach cancer Paternal Grandmother 38  . Colon cancer Neg Hx     Social History:  Social History   Socioeconomic History  . Marital status: Married    Spouse name: Not on file  . Number of children: 2  . Years of education: Not on file  . Highest education level: Not on file  Social Needs  . Financial resource strain: Not on file  . Food insecurity - worry: Not on file  . Food insecurity - inability: Not on file  . Transportation needs - medical: Not on file  . Transportation needs - non-medical: Not on file  Occupational History  . Occupation: [employer] consultant  Tobacco Use  . Smoking status: Never Smoker  . Smokeless tobacco: Never Used  Substance and Sexual Activity  . Alcohol use: No  . Drug use: No  . Sexual activity: Yes    Birth control/protection: Surgical  Other Topics Concern  . Not on file  Social History Narrative   Remarried summer of 1997   2 daughters    Allergies:  Allergies  Allergen Reactions  .  Neomycin-Bacitracin Zn-Polymyx Hives  . Tape Swelling and Rash    Rash  . Prednisone Itching    Medications:  Current Outpatient Medications:  .  acetaminophen (TYLENOL) 325 MG tablet, Take 650 mg by mouth every 6 (six) hours as needed for mild pain. , Disp: , Rfl:  .  citalopram (CELEXA) 20 MG tablet, TAKE 1 TABLET (20 MG TOTAL) BY MOUTH DAILY., Disp: 90 tablet, Rfl: 3 .  docusate sodium (COLACE) 100 MG capsule, Take 100 mg by mouth daily.  , Disp: , Rfl:  .  ibuprofen (ADVIL,MOTRIN) 200 MG tablet, Take 400 mg by mouth every 6 (six) hours as needed for moderate pain. , Disp: ,  Rfl:  .  Multiple Vitamin (MULTIVITAMIN) tablet, Take 1 tablet by mouth daily., Disp: , Rfl:     Physical Exam Vitals: BP 130/80   Pulse 68   Ht 5' 7.5" (1.715 m)   Wt 266 lb (120.7 kg)   BMI 41.05 kg/m   General: WF in NAD HEENT: normocephalic, anicteric Neck: no thyroid enlargement, no palpable nodules, no cervical lymphadenopathy  Pulmonary: No increased work of breathing, CTAB Cardiovascular: RRR, without murmur  Breast: Breast symmetrical, no tenderness, no palpable nodules or masses, no skin or nipple retraction present, no nipple discharge.  No axillary, infraclavicular or supraclavicular lymphadenopathy. Abdomen: Soft, non-tender, obese, non-distended.  Umbilicus without lesions.  No hepatomegaly or masses palpable. No evidence of hernia. Genitourinary:  External: Normal external female genitalia.  Normal urethral meatus, normal Bartholin's and Skene's glands.    Vagina: Normal vaginal mucosa, no evidence of prolapse.    Cervix: surgically absent  Uterus: surgically absent  Adnexa: No adnexal masses, non-tender  Rectal: no masses, hemoccult negative  Lymphatic: no evidence of inguinal lymphadenopathy Extremities: no edema, erythema, or tenderness.  Varicose veins present bilateral;y. Neurologic: Grossly intact Psychiatric: mood appropriate, affect full     Assessment: 56 y.o. annual gyn  exam  Plan:   1) Breast cancer screening - recommend monthly self breast exam and annual screening mammograms. Mammogram was ordered today. [place] to schedule for after 05/21/2017  2) Colon cancer screening: negative hemoccult. Declines referral for colonoscopy.  3) Cervical cancer screening - Pap smear due in 2 years. ASCCP guidelines and rational discussed.  Patient opts for every 3 years screening interval  4) Routine healthcare maintenance including cholesterol and diabetes screening managed by PCP   5) Follow up with Dr Georgianne Fick for weight loss in 2-3 months.  6) Discussed calcium and vitamin D3 requirements. Encouraged taking supplemnt.  Dalia Heading, CNM

## 2017-03-20 ENCOUNTER — Ambulatory Visit: Payer: Federal, State, Local not specified - PPO | Admitting: Obstetrics and Gynecology

## 2017-03-23 ENCOUNTER — Ambulatory Visit: Payer: Federal, State, Local not specified - PPO | Admitting: Family Medicine

## 2017-03-23 ENCOUNTER — Encounter: Payer: Self-pay | Admitting: Family Medicine

## 2017-03-23 DIAGNOSIS — J0100 Acute maxillary sinusitis, unspecified: Secondary | ICD-10-CM | POA: Diagnosis not present

## 2017-03-23 MED ORDER — AMOXICILLIN-POT CLAVULANATE 875-125 MG PO TABS: 1.0000 | ORAL_TABLET | Freq: Two times a day (BID) | ORAL | 0 refills | Status: DC

## 2017-03-23 MED ORDER — ALBUTEROL SULFATE HFA 108 (90 BASE) MCG/ACT IN AERS: 1.0000 | INHALATION_SPRAY | Freq: Four times a day (QID) | RESPIRATORY_TRACT | 0 refills | Status: DC | PRN

## 2017-03-23 NOTE — Progress Notes (Signed)
duration of symptoms: several days but worse in the last 48 hours.   Started with cold sx.  rhinorrhea:yes congestion:yes ear pain: no sore throat:prev but not now Cough:yes, with sputum, discolored.   Myalgias: no Fever: fever yesterday.   Taking baseline meds with mucinex.  Chest feels tight but she doesn't have diffuse aches.   Still with a lot of sinus pressure.  She has some left over cough syrup from prev.   Per HPI unless specifically indicated in ROS section   Meds, vitals, and allergies reviewed.   GEN: nad, alert and oriented HEENT: mucous membranes moist, TM w/o erythema, nasal epithelium injected, OP with cobblestoning NECK: supple w/o LA CV: rrr. PULM: ctab, no inc wob ABD: soft, +bs Max sinuses ttp x2

## 2017-03-23 NOTE — Patient Instructions (Addendum)
Rest and fluids.  Start augmentin and update [place] as needed.  Use the inhaler for the cough.  Take care.  Glad to see you.

## 2017-03-24 ENCOUNTER — Ambulatory Visit: Payer: Self-pay

## 2017-03-24 DIAGNOSIS — J209 Acute bronchitis, unspecified: Secondary | ICD-10-CM

## 2017-03-24 MED ORDER — DOXYCYCLINE HYCLATE 100 MG PO TABS: 100.0000 mg | ORAL_TABLET | Freq: Two times a day (BID) | ORAL | 0 refills | Status: DC

## 2017-03-24 MED ORDER — BENZONATATE 200 MG PO CAPS: 200.0000 mg | ORAL_CAPSULE | Freq: Three times a day (TID) | ORAL | 0 refills | Status: DC | PRN

## 2017-03-24 NOTE — Telephone Encounter (Addendum)
Patient advised.  Patient states her cough medication expired a year ago, is it still ok to take?

## 2017-03-24 NOTE — Telephone Encounter (Deleted)
Patient states her cough medicine has expired a year ago.  Is it still ok to take?

## 2017-03-24 NOTE — Telephone Encounter (Signed)
Pt. Reports an hour after taking her Augmentin she vomited last night. States she took medication with food. Afraid to take anymore. Requests another antibiotic and something for cough be called in to [facility].  Answer Assessment - Initial Assessment Questions 1. SYMPTOMS: "Do you have any symptoms?"     Pt. Vomited "violently after taking Augmentin". Afraid to take anymore. 2. SEVERITY: If symptoms are present, ask "Are they mild, moderate or severe?"     Pt. Request another antibiotic and something for cough be called in.  Protocols used: MEDICATION QUESTION CALL-A-AH

## 2017-03-24 NOTE — Telephone Encounter (Signed)
Change to doxy, rx sent.  Med list updated.  She has inhaler rx and reported having some leftover hydrocodone cough syrup that she can use for the cough.   Tessalon sent also.   Thanks.

## 2017-03-25 MED ORDER — HYDROCOD POLST-CPM POLST ER 10-8 MG/5ML PO SUER: 5.0000 mL | Freq: Two times a day (BID) | ORAL | 0 refills | Status: DC | PRN

## 2017-03-25 NOTE — Telephone Encounter (Signed)
rx sent.   She can dispose of the old rx.  Thanks.  5 day limit on rx due to STOP ACT.

## 2017-03-25 NOTE — Telephone Encounter (Signed)
Patient notified as instructed by telephone and verbalized understanding. 

## 2017-03-25 NOTE — Addendum Note (Signed)
Addended by: Tonia Ghent on: 03/25/2017 02:23 PM   Modules accepted: Orders

## 2017-03-26 ENCOUNTER — Ambulatory Visit (INDEPENDENT_AMBULATORY_CARE_PROVIDER_SITE_OTHER): Payer: Federal, State, Local not specified - PPO | Admitting: Obstetrics and Gynecology

## 2017-03-26 ENCOUNTER — Encounter: Payer: Self-pay | Admitting: Obstetrics and Gynecology

## 2017-03-26 VITALS — BP 130/78 | HR 69 | Ht 67.5 in | Wt 273.0 lb

## 2017-03-26 DIAGNOSIS — J0100 Acute maxillary sinusitis, unspecified: Secondary | ICD-10-CM | POA: Insufficient documentation

## 2017-03-26 DIAGNOSIS — Z6841 Body Mass Index (BMI) 40.0 and over, adult: Secondary | ICD-10-CM | POA: Diagnosis not present

## 2017-03-26 DIAGNOSIS — J019 Acute sinusitis, unspecified: Secondary | ICD-10-CM | POA: Insufficient documentation

## 2017-03-26 MED ORDER — PHENTERMINE HCL 37.5 MG PO TABS: 37.5000 mg | ORAL_TABLET | Freq: Every day | ORAL | 0 refills | Status: DC

## 2017-03-26 NOTE — Progress Notes (Signed)
Gynecology Office Visit  Chief Complaint:  Chief Complaint  Patient presents with  . Weight Check    History of Present Illness: Patientis a 57 y.o. [id number] female, who presents for the evaluation of weight gain. She has gained 7 pounds primarily over past 3 months. The patient states the following issues have contributed to her weight problem: daughter has moved out after high school, and her daughter's boyfriend passed away unexpectedly in a car wreck.  The patient has no additional symptoms. The patient specifically denies memory loss, muscle weakness, excessive thirst, and polyuria. Weight related co-morbidities include none. . She has tried phentermine in then the past with limited short term success.   Review of Systems: 10 point review of systems negative unless otherwise noted in HPI  Past Medical History:  Past Medical History:  Diagnosis Date  . Abdominal or pelvic swelling, mass or lump, unspecified site   . Abnormal weight gain   . Acute upper respiratory infections of unspecified site   . Acute venous embolism and thrombosis of unspecified deep vessels of lower extremity   . Anxiety   . Asymptomatic varicose veins   . Basal cell carcinoma 02/2011   nose  . Encounter for therapeutic drug monitoring   . Enlargement of lymph nodes   . Irritable bowel syndrome   . Long term (current) use of anticoagulants   . Mature cystic teratoma   . Morbid obesity with BMI of 40.0-44.9, adult ([place])   . Neuropathy    pinched nerve left hip/leg  . Obesity   . Other and unspecified special symptom or syndrome, not elsewhere classified   . Other congenital anomaly of toes   . Phlebitis and thrombophlebitis of superficial vessels of lower extremities   . Right leg DVT 09/27/09   anticoagulation with coumadin possibly until 3/12  . Strain of right Achilles tendon 8/11   per [place] [facility]-no tear    Past Surgical History:  Past Surgical History:  Procedure Laterality Date  . CATARACT  EXTRACTION, BILATERAL  06/2008  . FLEXIBLE SIGMOIDOSCOPY  2003   Mild active colitis (Dr. Vira Agar)  . MOHS SURGERY  02/2011   MOH's procedure for skin cancer on nose at [facility]  . NSVD     x2 (last 1991)  . PRE-MALIGNANT / BENIGN SKIN LESION EXCISION     left temporal area  . TOTAL ABDOMINAL HYSTERECTOMY W/ BILATERAL SALPINGOOPHORECTOMY  12/2007   TAH-BSO mature cystic teratoma  . VARICOSE VEIN SURGERY     sclerotherapy    Gynecologic History: No LMP recorded. Patient has had a hysterectomy.  Obstetric History: [id number]  Family History:  Family History  Problem Relation Age of Onset  . Coronary artery disease Father        s/p CABG and valve replacement and repair of heart aneurysm 2012  . Heart disease Father   . Stroke Father        Had sepsis from liver abscess  . Atrial fibrillation Mother   . Deep vein thrombosis Mother   . Clotting disorder Mother   . Hypertension Mother   . Breast cancer Cousin 40       maternal  . Stomach cancer Paternal Grandmother 5  . Colon cancer Neg Hx     Social History:  Social History   Socioeconomic History  . Marital status: Married    Spouse name: Not on file  . Number of children: 2  . Years of education: 81  . Highest education level: Not  on file  Social Needs  . Financial resource strain: Not on file  . Food insecurity - worry: Not on file  . Food insecurity - inability: Not on file  . Transportation needs - medical: Not on file  . Transportation needs - non-medical: Not on file  Occupational History  . Occupation: [employer] consultant  Tobacco Use  . Smoking status: Never Smoker  . Smokeless tobacco: Never Used  Substance and Sexual Activity  . Alcohol use: No  . Drug use: No  . Sexual activity: Yes    Partners: Male    Birth control/protection: Surgical  Other Topics Concern  . Not on file  Social History Narrative   Remarried summer of 1997   2 daughters    Allergies:  Allergies  Allergen Reactions   . Neomycin-Bacitracin Zn-Polymyx Hives  . Tape Swelling and Rash    Rash  . Augmentin [Amoxicillin-Pot Clavulanate] Other (See Comments)    Vomiting- not a PCN or amoxil allergy  . Prednisone Itching    Medications: Prior to Admission medications   Medication Sig Start Date End Date Taking? Authorizing Provider  albuterol (PROVENTIL HFA;VENTOLIN HFA) 108 (90 Base) MCG/ACT inhaler Inhale 1-2 puffs into the lungs every 6 (six) hours as needed for wheezing (or for coug). 03/23/17  Yes Tonia Ghent, MD  benzonatate (TESSALON) 200 MG capsule Take 1 capsule (200 mg total) by mouth 3 (three) times daily as needed for cough. 03/24/17  Yes Tonia Ghent, MD  chlorpheniramine-HYDROcodone [facility] PENNKINETIC ER) 10-8 MG/5ML SUER Take 5 mLs by mouth every 12 (twelve) hours as needed for cough (sedation caution). 03/25/17  Yes Tonia Ghent, MD  citalopram (CELEXA) 20 MG tablet TAKE 1 TABLET (20 MG TOTAL) BY MOUTH DAILY. 11/28/16  Yes Tonia Ghent, MD  docusate sodium (COLACE) 100 MG capsule Take 100 mg by mouth daily.     Yes [provider]  doxycycline (VIBRA-TABS) 100 MG tablet Take 1 tablet (100 mg total) by mouth 2 (two) times daily. 03/24/17  Yes Tonia Ghent, MD  ibuprofen (ADVIL,MOTRIN) 200 MG tablet Take 400 mg by mouth every 6 (six) hours as needed for moderate pain.    Yes [provider]  Multiple Vitamin (MULTIVITAMIN) tablet Take 1 tablet by mouth daily.   Yes [provider]    Physical Exam Blood pressure 130/78, pulse 69, height 5' 7.5" (1.715 m), weight 273 lb (123.8 kg). Body mass index is 42.13 kg/m.  No LMP recorded. Patient has had a hysterectomy. Wt Readings from Last 3 Encounters:  03/26/17 273 lb (123.8 kg)  03/23/17 272 lb (123.4 kg)  01/22/17 266 lb (120.7 kg)    General: NAD HEENT: normocephalic, anicteric Thyroid: no enlargement Pulmonary: no increased work of breathing Neurologic: Grossly intact Psychiatric: mood  appropriate, affect full  Assessment: 57 y.o. [id number] presenting for discussion of weight loss management options  Plan: Problem List Items Addressed This Visit    None      1) 1500 Calorie ADA Diet  2) Patient education given regarding appropriate lifestyle changes for weight loss including: regular physical activity, healthy coping strategies, caloric restriction and healthy eating patterns.  3) Patient will be started on weight loss medication. The risks and benefits and side effects of medication, such as Adipex (Phenteramine) ,  Tenuate (Diethylproprion), Belviq (lorcarsin), Contrave (buproprion/naltrexone), Qsymia (phentermine/topiramate), and Saxenda (liraglutide) is discussed. The pros and cons of suppressing appetite and boosting metabolism is discussed. Risks of tolerence and addiction is discussed  for selected agents discussed. Use of medicine will ne short term, such as 3-4 months at a time followed by a period of time off of the medicine to avoid these risks and side effects for Adipex, Qsymia, and Tenuate discussed. Pt to call with any negative side effects and agrees to keep follow up appts.  4) Comorbidity Screening - hypothyroidism screening, diabetes, and hyperlipidemia screening offered - TSH TODAY - DISCUSSED CONTRAVE  5) Encouraged weekly weight monitorig to track progress and sample 1 week food diary  6) Contraception - N/A menopausal s/p hysterectomy  7) 15 minutes face-to-face; counseling/coordination of care > 50 percent of visit  8) No Follow-up on file.   Malachy Mood, MD, [facility], [facility] 03/26/2017, 2:16 PM

## 2017-03-26 NOTE — Assessment & Plan Note (Signed)
Rest and fluids.  Start augmentin and update [place] as needed.  Use the inhaler for the cough.  Nontoxic.  See following phone notes.

## 2017-03-27 LAB — TSH: TSH: 1.8 u[IU]/mL (ref 0.450–4.500)

## 2017-04-28 ENCOUNTER — Ambulatory Visit: Payer: Federal, State, Local not specified - PPO | Admitting: Obstetrics and Gynecology

## 2017-05-08 ENCOUNTER — Ambulatory Visit (INDEPENDENT_AMBULATORY_CARE_PROVIDER_SITE_OTHER): Payer: Federal, State, Local not specified - PPO | Admitting: Obstetrics and Gynecology

## 2017-05-08 ENCOUNTER — Encounter: Payer: Self-pay | Admitting: Obstetrics and Gynecology

## 2017-05-08 VITALS — BP 122/82 | HR 79 | Ht 67.5 in | Wt 270.0 lb

## 2017-05-08 DIAGNOSIS — Z6841 Body Mass Index (BMI) 40.0 and over, adult: Secondary | ICD-10-CM

## 2017-05-08 MED ORDER — NALTREXONE-BUPROPION HCL ER 8-90 MG PO TB12: 2.0000 | ORAL_TABLET | Freq: Two times a day (BID) | ORAL | 2 refills | Status: DC

## 2017-05-08 NOTE — Progress Notes (Signed)
Gynecology Office Visit  Chief Complaint:  Chief Complaint  Patient presents with  . Weight Check    History of Present Illness: Patientis a 57 y.o. G22P2002 female, who presents for the evaluation of the desire to lose weight. She has lost 2 pounds 1 months. The patient states the following symptoms since starting her weight loss therapy: appetite suppression, energy, and weight loss.  The patient also reports no other ill effects. The patient specifically denies heart palpitations, anxiety, and insomnia.    Review of Systems: 10 point review of systems negative unless otherwise noted in HPI  Past Medical History:  Past Medical History:  Diagnosis Date  . Abdominal or pelvic swelling, mass or lump, unspecified site   . Abnormal weight gain   . Acute upper respiratory infections of unspecified site   . Acute venous embolism and thrombosis of unspecified deep vessels of lower extremity   . Anxiety   . Asymptomatic varicose veins   . Basal cell carcinoma 02/2011   nose  . Encounter for therapeutic drug monitoring   . Enlargement of lymph nodes   . Irritable bowel syndrome   . Long term (current) use of anticoagulants   . Mature cystic teratoma   . Morbid obesity with BMI of 40.0-44.9, adult ([place])   . Neuropathy    pinched nerve left hip/leg  . Obesity   . Other and unspecified special symptom or syndrome, not elsewhere classified   . Other congenital anomaly of toes   . Phlebitis and thrombophlebitis of superficial vessels of lower extremities   . Right leg DVT 09/27/09   anticoagulation with coumadin possibly until 3/12  . Strain of right Achilles tendon 8/11   per [place] [facility]-no tear    Past Surgical History:  Past Surgical History:  Procedure Laterality Date  . CATARACT EXTRACTION, BILATERAL  06/2008  . FLEXIBLE SIGMOIDOSCOPY  2003   Mild active colitis (Dr. Vira Agar)  . MOHS SURGERY  02/2011   MO[facility]'s procedure [facility] skin cancer on nose at [facility]  . NSVD     x2 (last  1991)  . PRE-MALIGNANT / BENIGN SKIN LESION EXCISION     left temporal area  . TOTAL ABDOMINAL HYSTERECTOMY W/ BILATERAL SALPINGOOPHORECTOMY  12/2007   TAH-BSO mature cystic teratoma  . VARICOSE VEIN SURGERY     sclerotherapy    Gynecologic History: No LMP recorded. Patient has had a hysterectomy.  Obstetric History: [id number]  Family History:  Family History  Problem Relation Age of Onset  . Coronary artery disease Father        s/p CABG and valve replacement and repair of heart aneurysm 2012  . Heart disease Father   . Stroke Father        Had sepsis from liver abscess  . Atrial fibrillation Mother   . Deep vein thrombosis Mother   . Clotting disorder Mother   . Hypertension Mother   . Breast cancer Cousin 40       maternal  . Stomach cancer Paternal Grandmother 72  . Colon cancer Neg Hx     Social History:  Social History   Socioeconomic History  . Marital status: Married    Spouse name: Not on file  . Number of children: 2  . Years of education: 35  . Highest education level: Not on file  Occupational History  . Occupation: Horticulturist, commercial  Social Needs  . Financial resource strain: Not on file  . Food insecurity:  Worry: Not on file    Inability: Not on file  . Transportation needs:    Medical: Not on file    Non-medical: Not on file  Tobacco Use  . Smoking status: Never Smoker  . Smokeless tobacco: Never Used  Substance and Sexual Activity  . Alcohol use: No  . Drug use: No  . Sexual activity: Yes    Partners: Male    Birth control/protection: Surgical  Lifestyle  . Physical activity:    Days per week: Not on file    Minutes per session: Not on file  . Stress: Not on file  Relationships  . Social connections:    Talks on phone: Not on file    Gets together: Not on file    Attends religious service: Not on file    Active member of club or organization: Not on file    Attends meetings of clubs or organizations: Not on file     Relationship status: Not on file  . Intimate partner violence:    Fear of current or ex partner: Not on file    Emotionally abused: Not on file    Physically abused: Not on file    Forced sexual activity: Not on file  Other Topics Concern  . Not on file  Social History Narrative   Remarried summer of 1997   2 daughters    Allergies:  Allergies  Allergen Reactions  . Neomycin-Bacitracin Zn-Polymyx Hives  . Tape Swelling and Rash    Rash/BLISTERS  . Augmentin [Amoxicillin-Pot Clavulanate] Other (See Comments)    Vomiting- not a PCN or amoxil allergy  . Prednisone Itching    Medications: Prior to Admission medications   Medication Sig Start Date End Date Taking? Authorizing Provider  citalopram (CELEXA) 20 MG tablet TAKE 1 TABLET (20 MG TOTAL) BY MOUTH DAILY. 11/28/16  Yes Tonia Ghent, MD  docusate sodium (COLACE) 100 MG capsule Take 100 mg by mouth daily.     Yes [provider]  Multiple Vitamin (MULTIVITAMIN) tablet Take 1 tablet by mouth daily.   Yes [provider]  phentermine (ADIPEX-P) 37.5 MG tablet Take 1 tablet (37.5 mg total) by mouth daily before breakfast. 03/26/17  Yes Malachy Mood, MD    Physical Exam Blood pressure 122/82, pulse 79, height 5' 7.5" (1.715 m), weight 270 lb (122.5 kg). Body mass index is 41.66 kg/m.  Wt Readings from Last 3 Encounters:  05/08/17 270 lb (122.5 kg)  03/26/17 273 lb (123.8 kg)  03/23/17 272 lb (123.4 kg)    General: NAD HEENT: normocephalic, anicteric Thyroid: no enlargement Pulmonary: no increased work of breathing Neurologic: Grossly intact Psychiatric: mood appropriate, affect full  Assessment: 57 y.o. [id number] No problem-specific Assessment & Plan notes found for this encounter.   Plan: Problem List Items Addressed This Visit    None    Visit Diagnoses    Class 3 severe obesity without serious comorbidity with body mass index (BMI) of 40.0 to 44.9 in adult, unspecified obesity type ([place])     -  Primary   Relevant Medications   Naltrexone-buPROPion HCl ER (CONTRAVE) 8-90 MG TB12      1) 1500 Calorie ADA Diet  2) Patient education given regarding appropriate lifestyle changes for weight loss including: regular physical activity, healthy coping strategies, caloric restriction and healthy eating patterns.  3) Patient will be started on weight loss medication. The risks and benefits and side effects of medication, such as Adipex (Phenteramine) ,  Tenuate (Diethylproprion), Belviq (  lorcarsin), Contrave (buproprion/naltrexone), Qsymia (phentermine/topiramate), and Saxenda (liraglutide) is discussed. The pros and cons of suppressing appetite and boosting metabolism is discussed. Risks of tolerence and addiction is discussed for selected agents discussed. Use of medicine will ne short term, such as 3-4 months at a time followed by a period of time off of the medicine to avoid these risks and side effects for Adipex, Qsymia, and Tenuate discussed. Pt to call with any negative side effects and agrees to keep follow up appts. - has been on belviq previously  4) Patient to take medication, with the benefits of appetite suppression and metabolism boost d/w pt, along with the side effects and risk factors of long term use that will be avoided with our use of short bursts of therapy. Rx provided switch to Contrave, discontinue citalopram.    5) 15 minutes face-to-face; with counseling/coordination of care > 50 percent of visit related to obesity and ongoing management/treatment   6)  Return in about 3 months (around 08/07/2017) for wt check.    Malachy Mood, MD, [facility], [facility] 05/08/2017, 4:47 PM

## 2017-05-29 DIAGNOSIS — Z9289 Personal history of other medical treatment: Secondary | ICD-10-CM | POA: Diagnosis not present

## 2017-05-29 DIAGNOSIS — Z1231 Encounter for screening mammogram for malignant neoplasm of breast: Secondary | ICD-10-CM | POA: Diagnosis not present

## 2017-08-06 ENCOUNTER — Ambulatory Visit: Payer: Federal, State, Local not specified - PPO | Admitting: Obstetrics and Gynecology

## 2017-08-06 ENCOUNTER — Encounter: Payer: Self-pay | Admitting: Obstetrics and Gynecology

## 2017-08-06 VITALS — BP 130/76 | HR 65 | Ht 67.5 in | Wt 276.0 lb

## 2017-08-06 DIAGNOSIS — Z6841 Body Mass Index (BMI) 40.0 and over, adult: Secondary | ICD-10-CM | POA: Diagnosis not present

## 2017-08-06 MED ORDER — PHENTERMINE HCL 37.5 MG PO TABS: 37.5000 mg | ORAL_TABLET | Freq: Every day | ORAL | 0 refills | Status: DC

## 2017-08-06 NOTE — Patient Instructions (Signed)
[facility] [address] [place] [address]  [facility]  [address] [place] [address]  Phone: [phone]  

## 2017-08-06 NOTE — Progress Notes (Signed)
Gynecology Office Visit  Chief Complaint:  Chief Complaint  Patient presents with  . Follow-up    Weight loss check    History of Present Illness: Patientis a 57 y.o. G53P2002 female, who presents for the evaluation of the desire to lose weight. She has gained 6pounds in 3 months. The patient states the following symptoms since starting her weight loss therapy: allergic reaction.  After second day of Contrave noted swelling, rash and discontinued.  Was placed on benadryl.    Review of Systems: 10 point review of systems negative unless otherwise noted in HPI  Past Medical History:  Past Medical History:  Diagnosis Date  . Abdominal or pelvic swelling, mass or lump, unspecified site   . Abnormal weight gain   . Acute upper respiratory infections of unspecified site   . Acute venous embolism and thrombosis of unspecified deep vessels of lower extremity   . Anxiety   . Asymptomatic varicose veins   . Basal cell carcinoma 02/2011   nose  . Encounter for therapeutic drug monitoring   . Enlargement of lymph nodes   . Irritable bowel syndrome   . Long term (current) use of anticoagulants   . Mature cystic teratoma   . Morbid obesity with BMI of 40.0-44.9, adult ([place])   . Neuropathy    pinched nerve left hip/leg  . Obesity   . Other and unspecified special symptom or syndrome, not elsewhere classified   . Other congenital anomaly of toes   . Phlebitis and thrombophlebitis of superficial vessels of lower extremities   . Right leg DVT 09/27/09   anticoagulation with coumadin possibly until 3/12  . Strain of right Achilles tendon 8/11   per [place] [facility]-no tear    Past Surgical History:  Past Surgical History:  Procedure Laterality Date  . CATARACT EXTRACTION, BILATERAL  06/2008  . FLEXIBLE SIGMOIDOSCOPY  2003   Mild active colitis (Dr. Vira Agar)  . MOHS SURGERY  02/2011   MOH's procedure for skin cancer on nose at [facility]  . NSVD     x2 (last 1991)  . PRE-MALIGNANT / BENIGN  SKIN LESION EXCISION     left temporal area  . TOTAL ABDOMINAL HYSTERECTOMY W/ BILATERAL SALPINGOOPHORECTOMY  12/2007   TAH-BSO mature cystic teratoma  . VARICOSE VEIN SURGERY     sclerotherapy    Gynecologic History: No LMP recorded. Patient has had a hysterectomy.  Obstetric History: [id number]  Family History:  Family History  Problem Relation Age of Onset  . Coronary artery disease Father        s/p CABG and valve replacement and repair of heart aneurysm 2012  . Heart disease Father   . Stroke Father        Had sepsis from liver abscess  . Atrial fibrillation Mother   . Deep vein thrombosis Mother   . Clotting disorder Mother   . Hypertension Mother   . Breast cancer Cousin 40       maternal  . Stomach cancer Paternal Grandmother 22  . Colon cancer Neg Hx     Social History:  Social History   Socioeconomic History  . Marital status: Married    Spouse name: Not on file  . Number of children: 2  . Years of education: 42  . Highest education level: Not on file  Occupational History  . Occupation: Horticulturist, commercial  Social Needs  . Financial resource strain: Not on file  . Food insecurity:    Worry:  Not on file    Inability: Not on file  . Transportation needs:    Medical: Not on file    Non-medical: Not on file  Tobacco Use  . Smoking status: Never Smoker  . Smokeless tobacco: Never Used  Substance and Sexual Activity  . Alcohol use: No  . Drug use: No  . Sexual activity: Yes    Partners: Male    Birth control/protection: Surgical  Lifestyle  . Physical activity:    Days per week: Not on file    Minutes per session: Not on file  . Stress: Not on file  Relationships  . Social connections:    Talks on phone: Not on file    Gets together: Not on file    Attends religious service: Not on file    Active member of club or organization: Not on file    Attends meetings of clubs or organizations: Not on file    Relationship status: Not on file    . Intimate partner violence:    Fear of current or ex partner: Not on file    Emotionally abused: Not on file    Physically abused: Not on file    Forced sexual activity: Not on file  Other Topics Concern  . Not on file  Social History Narrative   Remarried summer of 1997   2 daughters    Allergies:  Allergies  Allergen Reactions  . Neomycin-Bacitracin Zn-Polymyx Hives  . Tape Swelling and Rash    Rash/BLISTERS  . Wellbutrin [Bupropion] Swelling  . Augmentin [Amoxicillin-Pot Clavulanate] Other (See Comments)    Vomiting- not a PCN or amoxil allergy  . Prednisone Itching    Medications: Prior to Admission medications   Medication Sig Start Date End Date Taking? Authorizing Provider  aspirin EC 81 MG tablet Take by mouth.   Yes [provider]  citalopram (CELEXA) 20 MG tablet Take by mouth. 02/19/12  Yes [provider]  Multiple Vitamin (MULTIVITAMIN) tablet Take 1 tablet by mouth daily.   Yes [provider]    Physical Exam Blood pressure 130/76, pulse 65, height 5' 7.5" (1.715 m), weight 276 lb (125.2 kg). Wt Readings from Last 3 Encounters:  08/06/17 276 lb (125.2 kg)  05/08/17 270 lb (122.5 kg)  03/26/17 273 lb (123.8 kg)  Body mass index is 42.59 kg/m.   General: NAD HEENT: normocephalic, anicteric Thyroid: no enlargement Pulmonary: no increased work of breathing Neurologic: Grossly intact Psychiatric: mood appropriate, affect full  Assessment: 57 y.o. G2I9485 No problem-specific Assessment & Plan notes found for this encounter.   Plan: Problem List Items Addressed This Visit    None    Visit Diagnoses    Class 3 severe obesity without serious comorbidity with body mass index (BMI) of 40.0 to 44.9 in adult, unspecified obesity type ([place])    -  Primary   Relevant Medications   phentermine (ADIPEX-P) 37.5 MG tablet      1) 1500 Calorie ADA Diet  2) Patient education given regarding appropriate lifestyle changes for  weight loss including: regular physical activity, healthy coping strategies, caloric restriction and healthy eating patterns.  3) Patient to take medication, with the benefits of appetite suppression and metabolism boost d/w pt, along with the side effects and risk factors of long term use that will be avoided with our use of short bursts of therapy. Rx phentermine provided.    4) 15 minutes face-to-face; with counseling/coordination of care > 50 percent of visit related to  obesity and ongoing management/treatment   5) Return in about 1 month (around 09/03/2017).    Malachy Mood, MD, [facility], [facility] 08/06/2017, 3:27 PM

## 2017-08-07 ENCOUNTER — Ambulatory Visit: Payer: Self-pay | Admitting: Obstetrics and Gynecology

## 2017-09-07 ENCOUNTER — Encounter: Payer: Self-pay | Admitting: Obstetrics and Gynecology

## 2017-09-07 ENCOUNTER — Ambulatory Visit (INDEPENDENT_AMBULATORY_CARE_PROVIDER_SITE_OTHER): Payer: Federal, State, Local not specified - PPO | Admitting: Obstetrics and Gynecology

## 2017-09-07 VITALS — BP 124/84 | HR 76 | Ht 67.5 in | Wt 271.0 lb

## 2017-09-07 DIAGNOSIS — Z6841 Body Mass Index (BMI) 40.0 and over, adult: Secondary | ICD-10-CM | POA: Diagnosis not present

## 2017-09-07 MED ORDER — PHENTERMINE HCL 37.5 MG PO TABS: 37.5000 mg | ORAL_TABLET | Freq: Every day | ORAL | 0 refills | Status: DC

## 2017-09-07 MED ORDER — METFORMIN HCL 500 MG PO TABS: ORAL_TABLET | ORAL | 0 refills | Status: DC

## 2017-09-07 NOTE — Progress Notes (Signed)
Gynecology Office Visit  Chief Complaint:  Chief Complaint  Patient presents with  . Weight Check    History of Present Illness: Patientis a 57 y.o. G43P2002 female, who presents for the evaluation of the desire to lose weight. She has lost 5 pounds 1 months. The patient states the following symptoms since starting her weight loss therapy: appetite suppression, energy, and weight loss.  The patient also reports no other ill effects. The patient specifically denies heart palpitations, anxiety, and insomnia.    Review of Systems: 10 point review of systems negative unless otherwise noted in HPI  Past Medical History:  Past Medical History:  Diagnosis Date  . Abdominal or pelvic swelling, mass or lump, unspecified site   . Abnormal weight gain   . Acute upper respiratory infections of unspecified site   . Acute venous embolism and thrombosis of unspecified deep vessels of lower extremity   . Anxiety   . Asymptomatic varicose veins   . Basal cell carcinoma 02/2011   nose  . Encounter for therapeutic drug monitoring   . Enlargement of lymph nodes   . Irritable bowel syndrome   . Long term (current) use of anticoagulants   . Mature cystic teratoma   . Morbid obesity with BMI of 40.0-44.9, adult ([place])   . Neuropathy    pinched nerve left hip/leg  . Obesity   . Other and unspecified special symptom or syndrome, not elsewhere classified   . Other congenital anomaly of toes   . Phlebitis and thrombophlebitis of superficial vessels of lower extremities   . Right leg DVT 09/27/09   anticoagulation with coumadin possibly until 3/12  . Strain of right Achilles tendon 8/11   per [place] [facility]-no tear    Past Surgical History:  Past Surgical History:  Procedure Laterality Date  . CATARACT EXTRACTION, BILATERAL  06/2008  . FLEXIBLE SIGMOIDOSCOPY  2003   Mild active colitis (Dr. Vira Agar)  . MOHS SURGERY  02/2011   MOH's procedure for skin cancer on nose at [facility]  . NSVD     x2 (last  1991)  . PRE-MALIGNANT / BENIGN SKIN LESION EXCISION     left temporal area  . TOTAL ABDOMINAL HYSTERECTOMY W/ BILATERAL SALPINGOOPHORECTOMY  12/2007   TAH-BSO mature cystic teratoma  . VARICOSE VEIN SURGERY     sclerotherapy    Gynecologic History: No LMP recorded. Patient has had a hysterectomy.  Obstetric History: [id number]  Family History:  Family History  Problem Relation Age of Onset  . Coronary artery disease Father        s/p CABG and valve replacement and repair of heart aneurysm 2012  . Heart disease Father   . Stroke Father        Had sepsis from liver abscess  . Atrial fibrillation Mother   . Deep vein thrombosis Mother   . Clotting disorder Mother   . Hypertension Mother   . Breast cancer Cousin 40       maternal  . Stomach cancer Paternal Grandmother 80  . Colon cancer Neg Hx     Social History:  Social History   Socioeconomic History  . Marital status: Married    Spouse name: Not on file  . Number of children: 2  . Years of education: 6  . Highest education level: Not on file  Occupational History  . Occupation: Horticulturist, commercial  Social Needs  . Financial resource strain: Not on file  . Food insecurity:  Worry: Not on file    Inability: Not on file  . Transportation needs:    Medical: Not on file    Non-medical: Not on file  Tobacco Use  . Smoking status: Never Smoker  . Smokeless tobacco: Never Used  Substance and Sexual Activity  . Alcohol use: No  . Drug use: No  . Sexual activity: Yes    Partners: Male    Birth control/protection: Surgical  Lifestyle  . Physical activity:    Days per week: Not on file    Minutes per session: Not on file  . Stress: Not on file  Relationships  . Social connections:    Talks on phone: Not on file    Gets together: Not on file    Attends religious service: Not on file    Active member of club or organization: Not on file    Attends meetings of clubs or organizations: Not on file     Relationship status: Not on file  . Intimate partner violence:    Fear of current or ex partner: Not on file    Emotionally abused: Not on file    Physically abused: Not on file    Forced sexual activity: Not on file  Other Topics Concern  . Not on file  Social History Narrative   Remarried summer of 1997   2 daughters    Allergies:  Allergies  Allergen Reactions  . Neomycin-Bacitracin Zn-Polymyx Hives  . Tape Swelling and Rash    Rash/BLISTERS  . Wellbutrin [Bupropion] Swelling  . Augmentin [Amoxicillin-Pot Clavulanate] Other (See Comments)    Vomiting- not a PCN or amoxil allergy  . Prednisone Itching    Medications: Prior to Admission medications   Medication Sig Start Date End Date Taking? Authorizing Provider  clobetasol cream (TEMOVATE) 0.05 %  04/22/12  Yes [provider]  aspirin EC 81 MG tablet Take by mouth.    [provider]  citalopram (CELEXA) 20 MG tablet Take by mouth. 02/19/12   [provider]  Multiple Vitamin (MULTIVITAMIN) tablet Take 1 tablet by mouth daily.    [provider]  phentermine (ADIPEX-P) 37.5 MG tablet Take 1 tablet (37.5 mg total) by mouth daily before breakfast. 08/06/17   Malachy Mood, MD    Physical Exam Blood pressure 124/84, pulse 76, height 5' 7.5" (1.715 m), weight 271 lb (122.9 kg). Wt Readings from Last 3 Encounters:  09/07/17 271 lb (122.9 kg)  08/06/17 276 lb (125.2 kg)  05/08/17 270 lb (122.5 kg)  Body mass index is 41.82 kg/m.   General: NAD HEENT: normocephalic, anicteric Thyroid: no enlargement Pulmonary: no increased work of breathing Neurologic: Grossly intact Psychiatric: mood appropriate, affect full  Assessment: 57 y.o. [id number] medical weight loss management  Plan: Problem List Items Addressed This Visit    None    Visit Diagnoses    Class 3 severe obesity without serious comorbidity with body mass index (BMI) of 40.0 to 44.9 in adult, unspecified obesity type  ([place])    -  Primary   Relevant Medications   metFORMIN (GLUCOPHAGE) 500 MG tablet   phentermine (ADIPEX-P) 37.5 MG tablet      1) 1500 Calorie ADA Diet  2) Patient education given regarding appropriate lifestyle changes for weight loss including: regular physical activity, healthy coping strategies, caloric restriction and healthy eating patterns.  3) Continue phentermine.  Will trial metformin in addition to phentermine.  4) Patient to take medication, with the benefits of appetite suppression and metabolism  boost d/w pt, along with the side effects and risk factors of long term use that will be avoided with our use of short bursts of therapy. Rx provided.    5) 15 minutes face-to-face; with counseling/coordination of care > 50 percent of visit related to obesity and ongoing management/treatment   6)  Return in about 4 weeks (around 10/05/2017) for medication follow up.    Malachy Mood, MD, [facility], [facility] 09/07/2017, 5:15 PM

## 2017-09-18 DIAGNOSIS — M545 Low back pain: Secondary | ICD-10-CM | POA: Diagnosis not present

## 2017-10-05 ENCOUNTER — Ambulatory Visit: Payer: Federal, State, Local not specified - PPO | Admitting: Obstetrics and Gynecology

## 2017-10-21 ENCOUNTER — Telehealth: Payer: Self-pay

## 2017-10-21 NOTE — Telephone Encounter (Signed)
Pt has cancelled her appt. Thinks this is the end of this cycle of phentermine.  Is usually off of it three months.  Wants to know what month to schedule her next appointment.  [phone]

## 2017-10-21 NOTE — Telephone Encounter (Signed)
Pt states she finished her Phentermine last week. Aware to call back in 4 weeks to schedule follow up.

## 2017-10-23 ENCOUNTER — Ambulatory Visit: Payer: Federal, State, Local not specified - PPO | Admitting: Obstetrics and Gynecology

## 2017-11-09 ENCOUNTER — Ambulatory Visit: Payer: Federal, State, Local not specified - PPO | Admitting: Obstetrics and Gynecology

## 2017-11-09 ENCOUNTER — Encounter: Payer: Self-pay | Admitting: Obstetrics and Gynecology

## 2017-11-09 VITALS — BP 124/72 | HR 73 | Ht 67.5 in | Wt 271.0 lb

## 2017-11-09 DIAGNOSIS — Z6841 Body Mass Index (BMI) 40.0 and over, adult: Secondary | ICD-10-CM

## 2017-11-09 MED ORDER — METFORMIN HCL 500 MG PO TABS: ORAL_TABLET | ORAL | 0 refills | Status: DC

## 2017-11-09 NOTE — Progress Notes (Signed)
Gynecology Office Visit  Chief Complaint:  Chief Complaint  Patient presents with  . medication follow up    weight loss check    History of Present Illness: Patientis a 57 y.o. G80P2002 female, who presents for the evaluation of the desire to lose weight. She has lost 0 pounds 2 months. The patient states the following symptoms since starting her weight loss therapy: appetite suppression, energy, and weight loss.  The patient also reports no other ill effects. The patient specifically denies heart palpitations, anxiety, and insomnia.    Review of Systems: 10 point review of systems negative unless otherwise noted in HPI  Past Medical History:  Past Medical History:  Diagnosis Date  . Abdominal or pelvic swelling, mass or lump, unspecified site   . Abnormal weight gain   . Acute upper respiratory infections of unspecified site   . Acute venous embolism and thrombosis of unspecified deep vessels of lower extremity   . Anxiety   . Asymptomatic varicose veins   . Basal cell carcinoma 02/2011   nose  . Encounter for therapeutic drug monitoring   . Enlargement of lymph nodes   . Irritable bowel syndrome   . Long term (current) use of anticoagulants   . Mature cystic teratoma   . Morbid obesity with BMI of 40.0-44.9, adult ([place])   . Neuropathy    pinched nerve left hip/leg  . Obesity   . Other and unspecified special symptom or syndrome, not elsewhere classified   . Other congenital anomaly of toes   . Phlebitis and thrombophlebitis of superficial vessels of lower extremities   . Right leg DVT 09/27/09   anticoagulation with coumadin possibly until 3/12  . Strain of right Achilles tendon 8/11   per [place] [facility]-no tear    Past Surgical History:  Past Surgical History:  Procedure Laterality Date  . CATARACT EXTRACTION, BILATERAL  06/2008  . FLEXIBLE SIGMOIDOSCOPY  2003   Mild active colitis (Dr. Vira Agar)  . MOHS SURGERY  02/2011   MOH's procedure for skin cancer on nose  at [facility]  . NSVD     x2 (last 1991)  . PRE-MALIGNANT / BENIGN SKIN LESION EXCISION     left temporal area  . TOTAL ABDOMINAL HYSTERECTOMY W/ BILATERAL SALPINGOOPHORECTOMY  12/2007   TAH-BSO mature cystic teratoma  . VARICOSE VEIN SURGERY     sclerotherapy    Gynecologic History: No LMP recorded. Patient has had a hysterectomy.  Obstetric History: [id number]  Family History:  Family History  Problem Relation Age of Onset  . Coronary artery disease Father        s/p CABG and valve replacement and repair of heart aneurysm 2012  . Heart disease Father   . Stroke Father        Had sepsis from liver abscess  . Atrial fibrillation Mother   . Deep vein thrombosis Mother   . Clotting disorder Mother   . Hypertension Mother   . Breast cancer Cousin 40       maternal  . Stomach cancer Paternal Grandmother 22  . Colon cancer Neg Hx     Social History:  Social History   Socioeconomic History  . Marital status: Married    Spouse name: Not on file  . Number of children: 2  . Years of education: 9  . Highest education level: Not on file  Occupational History  . Occupation: Horticulturist, commercial  Social Needs  . Financial resource strain: Not on file  .  Food insecurity:    Worry: Not on file    Inability: Not on file  . Transportation needs:    Medical: Not on file    Non-medical: Not on file  Tobacco Use  . Smoking status: Never Smoker  . Smokeless tobacco: Never Used  Substance and Sexual Activity  . Alcohol use: No  . Drug use: No  . Sexual activity: Yes    Partners: Male    Birth control/protection: Surgical  Lifestyle  . Physical activity:    Days per week: Not on file    Minutes per session: Not on file  . Stress: Not on file  Relationships  . Social connections:    Talks on phone: Not on file    Gets together: Not on file    Attends religious service: Not on file    Active member of club or organization: Not on file    Attends meetings of clubs or  organizations: Not on file    Relationship status: Not on file  . Intimate partner violence:    Fear of current or ex partner: Not on file    Emotionally abused: Not on file    Physically abused: Not on file    Forced sexual activity: Not on file  Other Topics Concern  . Not on file  Social History Narrative   Remarried summer of 1997   2 daughters    Allergies:  Allergies  Allergen Reactions  . Neomycin-Bacitracin Zn-Polymyx Hives  . Tape Swelling and Rash    Rash/BLISTERS  . Wellbutrin [Bupropion] Swelling  . Augmentin [Amoxicillin-Pot Clavulanate] Other (See Comments)    Vomiting- not a PCN or amoxil allergy  . Prednisone Itching    Medications: Prior to Admission medications   Medication Sig Start Date End Date Taking? Authorizing Provider  aspirin EC 81 MG tablet Take by mouth.   Yes [provider]  citalopram (CELEXA) 20 MG tablet Take by mouth. 02/19/12  Yes [provider]  clobetasol cream (TEMOVATE) 0.05 %  04/22/12  Yes [provider]  Multiple Vitamin (MULTIVITAMIN) tablet Take 1 tablet by mouth daily.   Yes [provider]  phentermine (ADIPEX-P) 37.5 MG tablet Take 1 tablet (37.5 mg total) by mouth daily before breakfast. 09/07/17  Yes Malachy Mood, MD  metFORMIN (GLUCOPHAGE) 500 MG tablet Take one tablet by mouth daily for one week. Then increase to one tablet twice a day for one week.  Then two tablets twice a day. Patient not taking: Reported on 11/09/2017 09/07/17   Malachy Mood, MD    Physical Exam Blood pressure 124/72, pulse 73, height 5' 7.5" (1.715 m), weight 271 lb (122.9 kg). Wt Readings from Last 3 Encounters:  11/09/17 271 lb (122.9 kg)  09/07/17 271 lb (122.9 kg)  08/06/17 276 lb (125.2 kg)  Body mass index is 41.82 kg/m.   General: NAD HEENT: normocephalic, anicteric Thyroid: no enlargement Pulmonary: no increased work of breathing Neurologic: Grossly intact Psychiatric: mood appropriate,  affect full  Assessment: 57 y.o. [id number] follow up medical weight loss  Plan: Problem List Items Addressed This Visit    None      1) 1500 Calorie ADA Diet  2) Patient education given regarding appropriate lifestyle changes for weight loss including: regular physical activity, healthy coping strategies, caloric restriction and healthy eating patterns. - overall the patient is still down 10lbs from the start of the year  3) Discontinue phentermine start metformin.  BG 111 fasting on health screening at work  this year.   4) 15 minutes face-to-face; with counseling/coordination of care > 50 percent of visit related to obesity and ongoing management/treatment   5)  Return in about 8 weeks (around 01/04/2018) for medication follow up.   Malachy Mood, MD, [facility], [facility] 11/09/2017, 3:03 PM

## 2017-11-30 ENCOUNTER — Other Ambulatory Visit: Payer: Self-pay | Admitting: Obstetrics and Gynecology

## 2017-11-30 NOTE — Telephone Encounter (Signed)
Follow up appointment on 12/9. Please advise

## 2017-12-09 DIAGNOSIS — K080 Exfoliation of teeth due to systemic causes: Secondary | ICD-10-CM | POA: Diagnosis not present

## 2018-01-04 ENCOUNTER — Ambulatory Visit: Payer: Federal, State, Local not specified - PPO | Admitting: Obstetrics and Gynecology

## 2018-02-01 ENCOUNTER — Ambulatory Visit: Payer: Self-pay | Admitting: Certified Nurse Midwife

## 2018-02-28 NOTE — Progress Notes (Signed)
Gynecology Annual Exam  PCP: Tonia Ghent, MD  Chief Complaint:  Chief Complaint  Patient presents with  . Gynecologic Exam    No complaints    History of Present Illness:Joann Mendez presents today for her annual exam. She is a 58 year old Caucasian/White female , G 2 P 2 0 0 2 , s/p TAH/BSO . She is having no significant GYN problems.   She has had no spotting vaginally.   The patient's past medical history is notable for a history of a TAH/BSO for a mature cystic teratoma, anxiety, h/o superficial thrombophlebitis and DVT, BCC requiring MOHs surgery on her nose, and obesity. She is wondering whether her Celexa dose needs to be increased. She thinks she is on 10 mgm daily (does not usually take on the week ends), but in Epic, she has been prescribed 20 mgm Celexa. She is functioning at work and [place]. Dealing with family stressors and financial stressors. Her husband was also in the hospital x 3 weeks with sepsis from [place].   Since her last annual GYN exam dated 01/22/2017 , she has gained 7#.  She has been followed by Dr Georgianne Fick for the past 4 years for weight loss. She is currently on a drug free interval. She has weighed 260# or more over the last 11+ years. Yo yos back and forth by 20#.   She is not sexually active as both her and her husband have low libido (Husband has low testosterone.)   Her most recent pap smear was obtained 11/16/2015 and was NIL  Her most recent mammogram obtained on 05/29/2017 was normal. There is a positive history of breast cancer in her cousin. Genetic testing has not been done. There is no family history of ovarian cancer. The patient does not do self breast exams.  She denies a recent screening colonoscopy and is eligible. She had a negative hemoccult 12/2016. She denies a recent DEXA scan. The patient does not smoke.  The patient does not drink alcohol.  The patient does not use illegal drugs.  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   The patient does not get adequate calcium in her diet. Has been having some lactose intolerance sx.  She had a recent cholesterol screen in 2019 at work and that was normal..      Review of Systems: Review of Systems  Constitutional: Negative for chills, fever and weight loss.       Positive for weight gain.  HENT: Negative for congestion, sinus pain and sore throat.   Eyes: Negative for blurred vision and pain.  Respiratory: Negative for hemoptysis, shortness of breath and wheezing.   Cardiovascular: Negative for chest pain, palpitations and leg swelling.  Gastrointestinal: Negative for abdominal pain, blood in stool, diarrhea, heartburn, nausea and vomiting.  Genitourinary: Negative for dysuria, frequency, hematuria and urgency.  Musculoskeletal: Negative for back pain, joint pain and myalgias.  Skin: Negative for itching and rash.  Neurological: Negative for dizziness, tingling and headaches.  Endo/Heme/Allergies: Negative for environmental allergies and polydipsia. Does not bruise/bleed easily.       Negative for hirsutism   Psychiatric/Behavioral: Negative for depression. The patient is nervous/anxious. The patient does not have insomnia.     Past Medical History:  Past Medical History:  Diagnosis Date  . Abdominal or pelvic swelling, mass or lump, unspecified site   . Abnormal weight gain   . Acute upper respiratory infections of  unspecified site   . Acute venous embolism and thrombosis of unspecified deep vessels of lower extremity   . Anxiety   . Asymptomatic varicose veins   . Basal cell carcinoma 02/2011   nose  . Encounter for therapeutic drug monitoring   . Enlargement of lymph nodes   . Irritable bowel syndrome   . Long term (current) use of anticoagulants   . Mature cystic teratoma   . Morbid obesity with BMI of 40.0-44.9, adult ([place])   . Neuropathy    pinched nerve left hip/leg  . Obesity   . Other and unspecified special symptom or  syndrome, not elsewhere classified   . Other congenital anomaly of toes   . Phlebitis and thrombophlebitis of superficial vessels of lower extremities   . Right leg DVT 09/27/09   anticoagulation with coumadin possibly until 3/12  . Strain of right Achilles tendon 8/11   per [place] [facility]-no tear    Past Surgical History:  Past Surgical History:  Procedure Laterality Date  . CATARACT EXTRACTION, BILATERAL  06/2008  . FLEXIBLE SIGMOIDOSCOPY  2003   Mild active colitis (Dr. Vira Agar)  . MOHS SURGERY  02/2011   MOH's procedure for skin cancer on nose at [facility]  . NSVD     x2 (last 1991)  . PRE-MALIGNANT / BENIGN SKIN LESION EXCISION     left temporal area  . TOTAL ABDOMINAL HYSTERECTOMY W/ BILATERAL SALPINGOOPHORECTOMY  12/2007   TAH-BSO mature cystic teratoma  . VARICOSE VEIN SURGERY     sclerotherapy    Family History:  Family History  Problem Relation Age of Onset  . Coronary artery disease Father        s/p CABG and valve replacement and repair of heart aneurysm 2012  . Heart disease Father   . Stroke Father        Had sepsis from liver abscess  . Atrial fibrillation Mother   . Deep vein thrombosis Mother   . Clotting disorder Mother   . Hypertension Mother   . Breast cancer Cousin 40       maternal  . Stomach cancer Paternal Grandmother 58  . Colon cancer Neg Hx     Social History:  Social History   Socioeconomic History  . Marital status: Married    Spouse name: Not on file  . Number of children: 2  . Years of education: 28  . Highest education level: Not on file  Occupational History  . Occupation: Horticulturist, commercial  Social Needs  . Financial resource strain: Not on file  . Food insecurity:    Worry: Not on file    Inability: Not on file  . Transportation needs:    Medical: Not on file    Non-medical: Not on file  Tobacco Use  . Smoking status: Never Smoker  . Smokeless tobacco: Never Used  Substance and Sexual Activity  . Alcohol use: No  .  Drug use: No  . Sexual activity: Not Currently    Partners: Male    Birth control/protection: Surgical  Lifestyle  . Physical activity:    Days per week: 0 days    Minutes per session: Not on file  . Stress: Not on file  Relationships  . Social connections:    Talks on phone: Not on file    Gets together: Not on file    Attends religious service: Not on file    Active member of club or organization: Not on file    Attends meetings  of clubs or organizations: Not on file    Relationship status: Not on file  . Intimate partner violence:    Fear of current or ex partner: Not on file    Emotionally abused: Not on file    Physically abused: Not on file    Forced sexual activity: Not on file  Other Topics Concern  . Not on file  Social History Narrative   Remarried summer of 1997   2 daughters    Allergies:  Allergies  Allergen Reactions  . Neomycin-Bacitracin Zn-Polymyx Hives  . Tape Swelling and Rash    Rash/BLISTERS  . Wellbutrin [Bupropion] Swelling  . Augmentin [Amoxicillin-Pot Clavulanate] Other (See Comments)    Vomiting- not a PCN or amoxil allergy  . Prednisone Itching    Medications:  Current Outpatient Medications:  .  aspirin EC 81 MG tablet, Take by mouth., Disp: , Rfl:  .  citalopram (CELEXA) 20 MG tablet, Take by mouth., Disp: , Rfl:  .  clobetasol cream (TEMOVATE) 0.05 %, , Disp: , Rfl:  .  metFORMIN (GLUCOPHAGE) 500 MG tablet, PLEASE SEE ATTACHED FOR DETAILED DIRECTIONS (Patient not taking: Reported on 03/01/2018), Disp: 120 tablet, Rfl: 0 .  Multiple Vitamin (MULTIVITAMIN) tablet, Take 1 tablet by mouth daily., Disp: , Rfl:   B complex vitamin  Physical Exam Vitals: BP 120/72 (BP Location: Right Arm, Patient Position: Sitting, Cuff Size: Large)   Pulse 78   Ht 5' 7.5" (1.715 m)   Wt 280 lb (127 kg)   BMI 43.21 kg/m  General: WF in NAD HEENT: normocephalic, anicteric Neck: no thyroid enlargement, no palpable nodules, no cervical lymphadenopathy    Pulmonary: No increased work of breathing, CTAB Cardiovascular: RRR, with Grade II/VI systolic murmur in all areas  Breast: Breast symmetrical, no tenderness, no palpable nodules or masses, no skin or nipple retraction present, no nipple discharge.  No axillary, infraclavicular or supraclavicular lymphadenopathy. Abdomen: Soft, non-tender, obese, non-distended.  Umbilicus without lesions.  No hepatomegaly or masses palpable. No evidence of hernia. Genitourinary:  External: Normal external female genitalia.  Normal urethral meatus, normal Bartholin's and Skene's glands.    Vagina: Normal vaginal mucosa, no evidence of prolapse.    Cervix: surgically absent  Uterus: surgically absent  Adnexa: No adnexal masses, non-tender  Rectal: deferred  Lymphatic: no evidence of inguinal lymphadenopathy Extremities: no edema, erythema, or tenderness.  Varicose veins present bilateral;y. Neurologic: Grossly intact Psychiatric: mood appropriate, affect full  PHQ-8 score was 8 with question #9=0 GAD 7 score was 14 with last question answered somewhat difficult   Assessment: 58 y.o. annual gyn exam Anxiety-asked to check on current Celexa bottle for dose. If she is on 20 mgm tablets to take them daily (do not skip weekend doses), and if still not feeling better, to contact me or return to office. Refill of 20 mgm tablets sent to pharmacy.  Plan:   1) Breast cancer screening - recommend monthly self breast exam and continued annual screening mammograms.  Patient to schedule at [facility] for after 05/30/2018  2) Colon cancer screening: Cologuard ordered.  3) Cervical cancer screening - Pap smear due in 1 year. ASCCP guidelines and rational discussed.  Patient opts for every 3 years screening interval  4) Routine healthcare maintenance including cholesterol and diabetes screening managed at work   5) Follow up with Dr Georgianne Fick for weight loss. Discussed bariatric surgery as an option for weight loss. Given  information on [facility]  6) RTO 1 year and prn  Dalia Heading, CNM

## 2018-03-01 ENCOUNTER — Encounter: Payer: Self-pay | Admitting: Certified Nurse Midwife

## 2018-03-01 ENCOUNTER — Other Ambulatory Visit: Payer: Self-pay

## 2018-03-01 ENCOUNTER — Ambulatory Visit (INDEPENDENT_AMBULATORY_CARE_PROVIDER_SITE_OTHER): Payer: Federal, State, Local not specified - PPO | Admitting: Certified Nurse Midwife

## 2018-03-01 VITALS — BP 120/72 | HR 78 | Ht 67.5 in | Wt 280.0 lb

## 2018-03-01 DIAGNOSIS — Z01419 Encounter for gynecological examination (general) (routine) without abnormal findings: Secondary | ICD-10-CM

## 2018-03-01 DIAGNOSIS — F419 Anxiety disorder, unspecified: Secondary | ICD-10-CM

## 2018-03-01 DIAGNOSIS — Z1211 Encounter for screening for malignant neoplasm of colon: Secondary | ICD-10-CM

## 2018-03-01 MED ORDER — CITALOPRAM HYDROBROMIDE 20 MG PO TABS: 20.0000 mg | ORAL_TABLET | Freq: Every day | ORAL | 2 refills | Status: DC

## 2018-03-01 NOTE — Patient Instructions (Addendum)
[facility]-Bariatric surgery Cologuard for colon cancer screening

## 2018-03-26 ENCOUNTER — Other Ambulatory Visit: Payer: Self-pay | Admitting: Certified Nurse Midwife

## 2018-04-05 ENCOUNTER — Telehealth: Payer: Self-pay

## 2018-04-05 ENCOUNTER — Encounter: Payer: Self-pay | Admitting: Family Medicine

## 2018-04-05 ENCOUNTER — Ambulatory Visit: Payer: Federal, State, Local not specified - PPO | Admitting: Family Medicine

## 2018-04-05 ENCOUNTER — Ambulatory Visit (INDEPENDENT_AMBULATORY_CARE_PROVIDER_SITE_OTHER)
Admission: RE | Admit: 2018-04-05 | Discharge: 2018-04-05 | Disposition: A | Discharge status: Home / Self Care | Payer: Federal, State, Local not specified - PPO | Source: Ambulatory Visit | Attending: Family Medicine | Admitting: Family Medicine

## 2018-04-05 VITALS — BP 126/72 | HR 75 | Temp 98.4°F | Ht 67.5 in | Wt 286.1 lb

## 2018-04-05 DIAGNOSIS — R05 Cough: Secondary | ICD-10-CM

## 2018-04-05 DIAGNOSIS — R059 Cough, unspecified: Secondary | ICD-10-CM

## 2018-04-05 DIAGNOSIS — J209 Acute bronchitis, unspecified: Secondary | ICD-10-CM | POA: Diagnosis not present

## 2018-04-05 DIAGNOSIS — J0100 Acute maxillary sinusitis, unspecified: Secondary | ICD-10-CM | POA: Diagnosis not present

## 2018-04-05 MED ORDER — HYDROCOD POLST-CPM POLST ER 10-8 MG/5ML PO SUER: 5.0000 mL | Freq: Every evening | ORAL | 0 refills | Status: DC | PRN

## 2018-04-05 MED ORDER — ALBUTEROL SULFATE HFA 108 (90 BASE) MCG/ACT IN AERS: 1.0000 | INHALATION_SPRAY | Freq: Four times a day (QID) | RESPIRATORY_TRACT | 0 refills | Status: DC | PRN

## 2018-04-05 NOTE — Patient Instructions (Signed)
Go to the lab on the way out.  We'll contact you with your xray report. Use the inhaler in the day and the cough medicine as needed at night.  Rest and fluids.  Update [place] as needed.   Take care.  Glad to see you.

## 2018-04-05 NOTE — Telephone Encounter (Signed)
[place] Night - Client Nonclinical Telephone Record [place] Night - Client Client Site [facility] Physician Renford Dills - MD Contact Type Call Who Is Calling Patient / Member / Family / Caregiver Caller Name Lee Mont Phone Number [phone] Patient Name Joann Mendez Patient DOB 09-20-1960 Call Type Message Only Information Provided Reason for Call Request to Schedule Office Appointment Initial Comment Would like to make an appt. Additional Comment Call Closed By: Mauri Pole Transaction Date/Time: 04/05/2018 8:03:15 AM (ET)

## 2018-04-05 NOTE — Assessment & Plan Note (Deleted)
Still okay for outpatient f/u.  Use SABA prn initially, then tussionex as needed, sedation caution.  Rest and fluids.  Update [place] as needed.   See notes on CXR.   Would hold off on abx at this point, given duration and neg CXR.

## 2018-04-05 NOTE — Progress Notes (Signed)
Sx started about 3 days ago, with cough.  Felt worse yesterday.  Coughing more when she talks, if she gets hot.  She used leftover cough medicine last night.  Chest feels tight.  Rhinorrhea.  No fevers.  Some wheeze with exertion.  She hasn't used SABA prev.  No rash.  No ear pain.  ST from coughing.  Sputum is brown and greenish.  No diffuse aches.    Meds, vitals, and allergies reviewed.   ROS: Per HPI unless specifically indicated in ROS section   GEN: nad, alert and oriented HEENT: mucous membranes moist, tm w/o erythema, nasal exam w/o erythema, clear discharge noted,  OP with cobblestoning NECK: supple w/o LA CV: rrr.   PULM: ctab, no inc wob EXT: no edema SKIN: well perfused.  Max sinuses mildly ttp B, frontal sinuses not ttp.   CXR reviewed, negative.

## 2018-04-05 NOTE — Assessment & Plan Note (Signed)
With mild max sinus tenderness.  Still okay for outpatient f/u.  Use SABA prn initially, then tussionex as needed, sedation caution.  Rest and fluids.  Update [place] as needed.   See notes on CXR.   Would hold off on abx at this point, given duration and neg CXR.

## 2018-04-05 NOTE — Telephone Encounter (Signed)
Per chart review tab pt has appt with Dr Damita Dunnings 04/05/18 at 12 noon. I spoke with pt and she is not having any distress breathing and pt will cb to [facility] if condition changes or worsens prior to appt. FYI to Dr Damita Dunnings.

## 2018-04-08 ENCOUNTER — Telehealth: Payer: Self-pay | Admitting: Family Medicine

## 2018-04-08 MED ORDER — HYDROCOD POLST-CPM POLST ER 10-8 MG/5ML PO SUER: 5.0000 mL | Freq: Two times a day (BID) | ORAL | 0 refills | Status: DC | PRN

## 2018-04-08 NOTE — Telephone Encounter (Signed)
I sent the cough medicine, she can use with SABA.  Thanks.  I called patient.

## 2018-04-08 NOTE — Telephone Encounter (Signed)
Please triage this patient.  Thanks.

## 2018-04-08 NOTE — Telephone Encounter (Signed)
Initial note basically describes the patient's situation.  She feels a little better but she is still coughing up green/brown phlegm, fatigue.  Patient has no fever and CXR was normal.  Patient says she has finished the cough syrup which helps some but she needs more and pharmacy won't RF it. Please advise.

## 2018-04-08 NOTE — Telephone Encounter (Signed)
Best number to call [phone]  Pt called wanting [name] to call her.  Pt was very upset.  Stating she was in to see md and was given cough meds and she has taken all of it and is doing worse and needs an antibiotic and more cough meds.  She stated she has missed 4 days of work and needs to get back to work.   [facility]  Pt would like a call back from [place] today.

## 2018-05-12 NOTE — Telephone Encounter (Signed)
Pt seen 04/05/18.

## 2018-05-17 ENCOUNTER — Telehealth: Payer: Self-pay

## 2018-05-17 NOTE — Telephone Encounter (Signed)
Called pt to remind her to do cologard.  Pt states she will put that on her list of things to do for herself this week.

## 2018-07-19 DIAGNOSIS — Z1231 Encounter for screening mammogram for malignant neoplasm of breast: Secondary | ICD-10-CM | POA: Diagnosis not present

## 2018-09-16 ENCOUNTER — Other Ambulatory Visit: Payer: Self-pay | Admitting: Certified Nurse Midwife

## 2018-11-19 ENCOUNTER — Telehealth: Payer: Self-pay | Admitting: Family Medicine

## 2018-11-19 NOTE — Telephone Encounter (Signed)
I spoke with Leafy Ro about this and her response is:  i would say if she just wants to pop in to weigh, that would be okay this once but she would just need someone to meet her at the door and bring her back and stand with her due to safety.  and of course we have to screen her for COVID,  she needs to be sure to know that she cannot just "come by" when she wants and especially due to the pandemic this is not ideal.  Left detailed message on VM

## 2018-11-19 NOTE — Telephone Encounter (Signed)
Patient called and said she would like to come by first thing in the morning or at the end of the day on Monday and weigh. Patient said she doesn't have a scale.  Patient said she's gained weight since the pandemic started.  Please call patient back and let her know if this can be done.

## 2019-01-28 HISTORY — PX: RETINAL DETACHMENT SURGERY: SHX105

## 2019-03-01 LAB — COLOGUARD

## 2019-03-08 ENCOUNTER — Ambulatory Visit: Payer: Federal, State, Local not specified - PPO | Admitting: Certified Nurse Midwife

## 2019-03-12 ENCOUNTER — Other Ambulatory Visit: Payer: Self-pay | Admitting: Certified Nurse Midwife

## 2019-04-05 ENCOUNTER — Ambulatory Visit: Payer: Federal, State, Local not specified - PPO | Admitting: Certified Nurse Midwife

## 2019-04-21 ENCOUNTER — Ambulatory Visit: Payer: Federal, State, Local not specified - PPO | Admitting: Certified Nurse Midwife

## 2019-05-04 NOTE — Progress Notes (Signed)
Gynecology Annual Exam  PCP: Tonia Ghent, MD  Chief Complaint:  Chief Complaint  Patient presents with  . Gynecologic Exam    No concerns    History of Present Illness:Joann Mendez presents today for her annual exam. She is a 59 year old Caucasian/White female , G 2 P 2 0 0 2 , s/p TAH/BSO . She is having no significant GYN problems.   She has had no spotting vaginally.   The patient's past medical history is notable for a history of a TAH/BSO for a mature cystic teratoma, anxiety, h/o superficial thrombophlebitis and DVT, BCC requiring MOHs surgery on her nose, and obesity.  Since her last annual GYN exam dated 03/01/2018 , she has gained more weight while working from home on the computer. She currently weighs 10# more than at her last annual, but she reports having lost about 10# over the last month working out with her daughter who has moved back home. She is also starting to eat cleaner and watch portion size.  She has weighed 260# or more over the last 12+ years. Yo yos back and forth by 20#. Current BMI 44.72 kg/m2. In better spirits, and looking forward to the future and retirement and traveling.  She has not been sexually active as both her and her husband have low libido (Husband has low testosterone.)   Her most recent pap smear was obtained 11/16/2015 and was NIL  Her most recent mammogram obtained on 07/19/2018 was normal. There is a positive history of breast cancer in her cousin. Genetic testing has not been done. There is no family history of ovarian cancer. The patient does not do self breast exams.  She denies a recent screening colonoscopy and is eligible. She had a negative hemoccult 12/2016. She has not submitted a stool specimen for the Cologuard that was ordered last year. She denies a recent DEXA scan. The patient does not smoke.  The patient does not drink alcohol.  The patient does not use illegal drugs.  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    The patient may not get adequate calcium in her diet. Has been having some lactose intolerance sx. Does eat cheese. She had a recent cholesterol screen in 2019 at work and that was normal..      Review of Systems: Review of Systems  Constitutional: Negative for chills, fever and weight loss.       Positive for weight gain.  HENT: Negative for congestion, sinus pain and sore throat.   Eyes: Negative for blurred vision and pain.  Respiratory: Negative for hemoptysis, shortness of breath and wheezing.   Cardiovascular: Negative for chest pain, palpitations and leg swelling.  Gastrointestinal: Negative for abdominal pain, blood in stool, diarrhea, heartburn, nausea and vomiting.  Genitourinary: Negative for dysuria, frequency, hematuria and urgency.  Musculoskeletal: Negative for back pain, joint pain and myalgias.  Skin: Negative for itching and rash.  Neurological: Negative for dizziness, tingling and headaches.  Endo/Heme/Allergies: Negative for environmental allergies and polydipsia. Does not bruise/bleed easily.       Negative for hirsutism   Psychiatric/Behavioral: Negative for depression. The patient is not nervous/anxious and does not have insomnia.     Past Medical History:  Past Medical History:  Diagnosis Date  . Abdominal or pelvic swelling, mass or lump, unspecified site   . Abnormal weight gain   . Acute upper  respiratory infections of unspecified site   . Acute venous embolism and thrombosis of unspecified deep vessels of lower extremity   . Anxiety   . Asymptomatic varicose veins   . Basal cell carcinoma 02/2011   nose  . Encounter for therapeutic drug monitoring   . Enlargement of lymph nodes   . Irritable bowel syndrome   . Long term (current) use of anticoagulants   . Mature cystic teratoma   . Morbid obesity with BMI of 40.0-44.9, adult ([facility])   . Neuropathy    pinched nerve left hip/leg  . Obesity   .  Other and unspecified special symptom or syndrome, not elsewhere classified   . Other congenital anomaly of toes   . Phlebitis and thrombophlebitis of superficial vessels of lower extremities   . Right leg DVT 09/27/09   anticoagulation with coumadin possibly until 3/12  . Strain of right Achilles tendon 8/11   per [place] [facility]-no tear    Past Surgical History:  Past Surgical History:  Procedure Laterality Date  . CATARACT EXTRACTION, BILATERAL  06/2008  . FLEXIBLE SIGMOIDOSCOPY  2003   Mild active colitis (Dr. Vira Agar)  . MOHS SURGERY  02/2011   [facility] procedure [facility] nose [facility]  . NSVD     x2 (last 1991)  . PRE-MALIGNANT / BENIGN SKIN LESION EXCISION     left temporal area  . TOTAL ABDOMINAL HYSTERECTOMY W/ BILATERAL SALPINGOOPHORECTOMY  12/2007   TAH-BSO mature cystic teratoma  . VARICOSE VEIN SURGERY     sclerotherapy    Family History:  Family History  Problem Relation Age of Onset  . Coronary artery disease Father        s/p CABG and valve replacement and repair of heart aneurysm 2012  . Heart disease Father   . Stroke Father        Had sepsis from liver abscess  . Atrial fibrillation Mother   . Deep vein thrombosis Mother   . Clotting disorder Mother   . Hypertension Mother   . Breast cancer Cousin 40       maternal  . Stomach cancer Paternal Grandmother 67  . Colon cancer Neg Hx     Social History:  Social History   Socioeconomic History  . Marital status: Married    Spouse name: Not on file  . Number of children: 2  . Years of education: 47  . Highest education level: Not on file  Occupational History  . Occupation: [employer] consultant  Tobacco Use  . Smoking status: Never Smoker  . Smokeless tobacco: Never Used  Substance and Sexual Activity  . Alcohol use: No  . Drug use: No  . Sexual activity: Not Currently    Partners: Male    Birth control/protection: Surgical  Other Topics Concern  . Not on file  Social History  Narrative   Remarried summer of 1997   2 daughters   Social Determinants of Health   Financial Resource Strain:   . Difficulty of Paying Living Expenses:   Food Insecurity:   . Worried About Charity fundraiser in the Last Year:   . Arboriculturist in the Last Year:   Transportation Needs:   . Film/video editor (Medical):   Marland Kitchen Lack of Transportation (Non-Medical):   Physical Activity:   . Days of Exercise per Week:   . Minutes of Exercise per Session:   Stress:   . Feeling of Stress :   Social Connections:   .  Frequency of Communication with Friends and Family:   . Frequency of Social Gatherings with Friends and Family:   . Attends Religious Services:   . Active Member of Clubs or Organizations:   . Attends Archivist Meetings:   Marland Kitchen Marital Status:   Intimate Partner Violence:   . Fear of Current or Ex-Partner:   . Emotionally Abused:   Marland Kitchen Physically Abused:   . Sexually Abused:     Allergies:  Allergies  Allergen Reactions  . Neomycin-Bacitracin Zn-Polymyx Hives  . Tape Swelling and Rash    Rash/BLISTERS  . Wellbutrin [Bupropion] Swelling  . Augmentin [Amoxicillin-Pot Clavulanate] Other (See Comments)    Vomiting- not a PCN or amoxil allergy  . Prednisone Itching    Medications:  Current Outpatient Medications:  .  aspirin EC 81 MG tablet, Take by mouth., Disp: , Rfl:  .  citalopram (CELEXA) 20 MG tablet, Take 1 tablet (20 mg total) by mouth daily., Disp: 90 tablet, Rfl: 3 .  docusate sodium (COLACE) 100 MG capsule, Take 100 mg by mouth daily as needed for mild constipation., Disp: , Rfl:  .  Multiple Vitamin (MULTIVITAMIN) tablet, Take 1 tablet by mouth daily., Disp: , Rfl:    Physical Exam Vitals: BP 130/82   Ht 5' 7.5" (1.715 m)   Wt 290 lb (131.5 kg)   BMI 44.75 kg/m  General: WF in NAD HEENT: normocephalic, anicteric Neck: no thyroid enlargement, no palpable nodules, no cervical lymphadenopathy  Pulmonary: No increased work of breathing,  CTAB Cardiovascular: RRR, without murmur Breast: Breast symmetrical, no tenderness, no palpable nodules or masses, no skin or nipple retraction present, no nipple discharge.  No axillary, infraclavicular or supraclavicular lymphadenopathy. Abdomen: Soft, non-tender, obese, non-distended.  Umbilicus without lesions.  No hepatomegaly or masses palpable. No evidence of hernia. Genitourinary:  External: Normal external female genitalia.  Normal urethral meatus, normal Bartholin's and Skene's glands.    Vagina: Normal vaginal mucosa, no evidence of prolapse.    Cervix: surgically absent  Uterus: surgically absent  Adnexa: No adnexal masses, non-tender  Rectal: deferred  Lymphatic: no evidence of inguinal lymphadenopathy Extremities: no edema, erythema, or tenderness.  Varicose veins present bilateral;y. Neurologic: Grossly intact Psychiatric: mood appropriate, affect full  GAD 7 score was 12 with last question answered somewhat difficult   Assessment: 59 y.o. annual gyn exam Anxiety-continue on Celexa  Refill Celexa 20 mgm daily #90/RF x 3  Plan:   1) Breast cancer screening - recommend monthly self breast exam and continued annual screening mammograms.  Patient to schedule at [facility] for after 07/19/19  2) Colon cancer screening: Cologuard reordered after discussion regarding options  3) Cervical cancer screening - Pap smear done and sent to [facility]. ASCCP guidelines and rational discussed.  Patient opts for every 3 years screening interval  4) Routine healthcare maintenance including cholesterol and diabetes screening managed at work Last hemoglobin A1C was 2 years ago. Recommend repeating (wants to come back at a later time).  5) Recommend continuing with exercise program and current diet regime. Discussed calcium and vitamin D3 requirements  6) RX for thigh high compression hose for varicose veins  7) RTO 1 year and prn  Dalia Heading, CNM

## 2019-05-05 ENCOUNTER — Ambulatory Visit (INDEPENDENT_AMBULATORY_CARE_PROVIDER_SITE_OTHER): Payer: Federal, State, Local not specified - PPO | Admitting: Certified Nurse Midwife

## 2019-05-05 ENCOUNTER — Other Ambulatory Visit: Payer: Self-pay

## 2019-05-05 ENCOUNTER — Encounter: Payer: Self-pay | Admitting: Certified Nurse Midwife

## 2019-05-05 VITALS — BP 130/82 | Ht 67.5 in | Wt 290.0 lb

## 2019-05-05 DIAGNOSIS — E669 Obesity, unspecified: Secondary | ICD-10-CM

## 2019-05-05 DIAGNOSIS — I83813 Varicose veins of bilateral lower extremities with pain: Secondary | ICD-10-CM | POA: Diagnosis not present

## 2019-05-05 DIAGNOSIS — Z6841 Body Mass Index (BMI) 40.0 and over, adult: Secondary | ICD-10-CM | POA: Diagnosis not present

## 2019-05-05 DIAGNOSIS — Z1211 Encounter for screening for malignant neoplasm of colon: Secondary | ICD-10-CM

## 2019-05-05 DIAGNOSIS — Z1272 Encounter for screening for malignant neoplasm of vagina: Secondary | ICD-10-CM

## 2019-05-05 DIAGNOSIS — Z01419 Encounter for gynecological examination (general) (routine) without abnormal findings: Secondary | ICD-10-CM

## 2019-05-05 DIAGNOSIS — Z1239 Encounter for other screening for malignant neoplasm of breast: Secondary | ICD-10-CM

## 2019-05-05 MED ORDER — CITALOPRAM HYDROBROMIDE 20 MG PO TABS: 20.0000 mg | ORAL_TABLET | Freq: Every day | ORAL | 3 refills | Status: DC

## 2019-06-03 ENCOUNTER — Telehealth: Payer: Self-pay | Admitting: Certified Nurse Midwife

## 2019-06-03 ENCOUNTER — Other Ambulatory Visit: Payer: Self-pay | Admitting: Certified Nurse Midwife

## 2019-06-03 DIAGNOSIS — Z131 Encounter for screening for diabetes mellitus: Secondary | ICD-10-CM

## 2019-06-03 DIAGNOSIS — Z13220 Encounter for screening for lipoid disorders: Secondary | ICD-10-CM

## 2019-06-03 NOTE — Telephone Encounter (Signed)
Done. Please have her come in for fasting labs.

## 2019-06-03 NOTE — Telephone Encounter (Signed)
Patient calling to schedule lab appointment. Please place lab orders

## 2019-06-03 NOTE — Telephone Encounter (Signed)
Patient is schedule for 06/06/19

## 2019-06-06 ENCOUNTER — Other Ambulatory Visit: Payer: Self-pay

## 2019-06-06 ENCOUNTER — Other Ambulatory Visit: Payer: Federal, State, Local not specified - PPO

## 2019-06-06 DIAGNOSIS — Z13220 Encounter for screening for lipoid disorders: Secondary | ICD-10-CM | POA: Diagnosis not present

## 2019-06-06 DIAGNOSIS — Z131 Encounter for screening for diabetes mellitus: Secondary | ICD-10-CM | POA: Diagnosis not present

## 2019-06-07 LAB — LIPID PANEL WITH LDL/HDL RATIO
Cholesterol, Total: 167 mg/dL (ref 100–199)
HDL: 49 mg/dL (ref >39)
LDL Chol Calc (NIH): 102 mg/dL — ABNORMAL HIGH (ref 0–99)
LDL/HDL Ratio: 2.1 ratio (ref 0.0–3.2)
Triglycerides: 88 mg/dL (ref 0–149)
VLDL Cholesterol Cal: 16 mg/dL (ref 5–40)

## 2019-06-07 LAB — HEMOGLOBIN A1C
Est. average glucose Bld gHb Est-mCnc: 120 mg/dL
Hgb A1c MFr Bld: 5.8 % — ABNORMAL HIGH (ref 4.8–5.6)

## 2019-06-07 LAB — GLUCOSE, RANDOM: Glucose: 102 mg/dL — ABNORMAL HIGH (ref 65–99)

## 2019-06-08 ENCOUNTER — Encounter: Payer: Self-pay | Admitting: Certified Nurse Midwife

## 2019-06-24 ENCOUNTER — Telehealth: Payer: Self-pay

## 2019-06-24 NOTE — Telephone Encounter (Signed)
Spoke with patient regarding pap and lab results.

## 2019-06-24 NOTE — Telephone Encounter (Signed)
Left multiple messages for patient to call office regarding her results that were mailed to her but returned in the mail due to vacant address.

## 2019-07-22 ENCOUNTER — Encounter: Payer: Self-pay | Admitting: Certified Nurse Midwife

## 2019-07-22 DIAGNOSIS — Z9289 Personal history of other medical treatment: Secondary | ICD-10-CM | POA: Diagnosis not present

## 2019-07-22 DIAGNOSIS — Z1231 Encounter for screening mammogram for malignant neoplasm of breast: Secondary | ICD-10-CM | POA: Diagnosis not present

## 2019-11-01 DIAGNOSIS — H579 Unspecified disorder of eye and adnexa: Secondary | ICD-10-CM | POA: Diagnosis not present

## 2019-11-22 DIAGNOSIS — H33001 Unspecified retinal detachment with retinal break, right eye: Secondary | ICD-10-CM | POA: Diagnosis not present

## 2019-11-23 ENCOUNTER — Encounter: Payer: Self-pay | Admitting: Family Medicine

## 2019-11-23 DIAGNOSIS — H3321 Serous retinal detachment, right eye: Secondary | ICD-10-CM | POA: Diagnosis not present

## 2019-11-23 DIAGNOSIS — H43811 Vitreous degeneration, right eye: Secondary | ICD-10-CM | POA: Diagnosis not present

## 2019-11-23 DIAGNOSIS — Z961 Presence of intraocular lens: Secondary | ICD-10-CM | POA: Diagnosis not present

## 2019-11-23 DIAGNOSIS — H33001 Unspecified retinal detachment with retinal break, right eye: Secondary | ICD-10-CM | POA: Diagnosis not present

## 2019-11-23 DIAGNOSIS — E669 Obesity, unspecified: Secondary | ICD-10-CM | POA: Diagnosis not present

## 2019-11-23 DIAGNOSIS — Z20822 Contact with and (suspected) exposure to covid-19: Secondary | ICD-10-CM | POA: Diagnosis not present

## 2019-11-23 DIAGNOSIS — M1990 Unspecified osteoarthritis, unspecified site: Secondary | ICD-10-CM | POA: Diagnosis not present

## 2019-11-23 DIAGNOSIS — Z90722 Acquired absence of ovaries, bilateral: Secondary | ICD-10-CM | POA: Diagnosis not present

## 2019-11-23 DIAGNOSIS — Z6841 Body Mass Index (BMI) 40.0 and over, adult: Secondary | ICD-10-CM | POA: Diagnosis not present

## 2019-11-23 DIAGNOSIS — H33009 Unspecified retinal detachment with retinal break, unspecified eye: Secondary | ICD-10-CM | POA: Diagnosis not present

## 2019-11-24 DIAGNOSIS — H33001 Unspecified retinal detachment with retinal break, right eye: Secondary | ICD-10-CM | POA: Diagnosis not present

## 2019-12-02 DIAGNOSIS — H33001 Unspecified retinal detachment with retinal break, right eye: Secondary | ICD-10-CM | POA: Diagnosis not present

## 2020-01-02 DIAGNOSIS — H33001 Unspecified retinal detachment with retinal break, right eye: Secondary | ICD-10-CM | POA: Diagnosis not present

## 2020-01-04 DIAGNOSIS — H3321 Serous retinal detachment, right eye: Secondary | ICD-10-CM | POA: Diagnosis not present

## 2020-01-04 DIAGNOSIS — Z20822 Contact with and (suspected) exposure to covid-19: Secondary | ICD-10-CM | POA: Diagnosis not present

## 2020-01-04 DIAGNOSIS — Z961 Presence of intraocular lens: Secondary | ICD-10-CM | POA: Diagnosis not present

## 2020-01-04 DIAGNOSIS — Z85828 Personal history of other malignant neoplasm of skin: Secondary | ICD-10-CM | POA: Diagnosis not present

## 2020-01-04 DIAGNOSIS — H33001 Unspecified retinal detachment with retinal break, right eye: Secondary | ICD-10-CM | POA: Diagnosis not present

## 2020-01-04 DIAGNOSIS — H33021 Retinal detachment with multiple breaks, right eye: Secondary | ICD-10-CM | POA: Diagnosis not present

## 2020-01-05 DIAGNOSIS — H33001 Unspecified retinal detachment with retinal break, right eye: Secondary | ICD-10-CM | POA: Diagnosis not present

## 2020-02-17 ENCOUNTER — Telehealth (INDEPENDENT_AMBULATORY_CARE_PROVIDER_SITE_OTHER): Payer: Federal, State, Local not specified - PPO | Admitting: Primary Care

## 2020-02-17 ENCOUNTER — Telehealth: Payer: Federal, State, Local not specified - PPO | Admitting: Family Medicine

## 2020-02-17 ENCOUNTER — Other Ambulatory Visit: Payer: Federal, State, Local not specified - PPO

## 2020-02-17 ENCOUNTER — Telehealth: Payer: Federal, State, Local not specified - PPO | Admitting: Primary Care

## 2020-02-17 DIAGNOSIS — R059 Cough, unspecified: Secondary | ICD-10-CM

## 2020-02-17 MED ORDER — BENZONATATE 200 MG PO CAPS: 200.0000 mg | ORAL_CAPSULE | Freq: Three times a day (TID) | ORAL | 0 refills | Status: DC | PRN

## 2020-02-17 MED ORDER — HYDROCOD POLST-CPM POLST ER 10-8 MG/5ML PO SUER: 5.0000 mL | Freq: Two times a day (BID) | ORAL | 0 refills | Status: DC | PRN

## 2020-02-17 NOTE — Patient Instructions (Signed)
You may take Benzonatate capsules for cough. Take 1 capsule by mouth three times daily as needed for cough.  You may take the cough suppressant every 12 hours as needed for cough and rest. Caution this medication contains codeine which may cause drowsiness.   Be sure to hydrate well.  Please call me on the 25th if symptoms have not improved and/or if symptoms are much worse.  It was a pleasure meeting you! Allie Bossier, NP-C

## 2020-02-17 NOTE — Progress Notes (Signed)
Subjective:    Patient ID: Joann Mendez, female    DOB: 1960-07-19, 60 y.o.   MRN: [id number]  HPI  Virtual Visit via Video Note  I connected with Joann Mendez on 02/17/20 at 12:00 PM EST by a video enabled telemedicine application and verified that I am speaking with the correct person using two identifiers.  We attempted to connect via video but she could not get her camera to enable. We conducted our visit via phone which lasted 12 min and 38 sec.  Location: Patient: Home Provider: Office Participants: Patient and myself   I discussed the limitations of evaluation and management by telemedicine and the availability of in person appointments. The patient expressed understanding and agreed to proceed.  History of Present Illness:  Joann Mendez is a 60 year old female patient of Dr. Damita Dunnings with a history of bronchitis and community acquired pneumonia who presents today with a chief complaint of cough.  Symptoms began about 3-4 days ago with cough, congestion, sneezing, headaches. She was out on her tractor a few days prior to her symptoms, thinks the cold weather "got to me". She thinks she had a fever a few days ago with symptoms of chills, did not check her temperature, has not had these symptoms since.   She has had 2 retinal surgeries (October 2021, December 2021), has gas bubble, has follow up visit with ophthalmologist soon.   She's been taking Mucinex, Tussionex during the evening, Delsym during the day, and Tylenol with improvement.   She has had two Pfizer Covid vaccines, plans on getting her booster vaccine soon. Also had a flu shot this season. No recent Covid-19 testing. She double masks and works from home.   Observations/Objective:  Alert and oriented. Appears well, not sickly. Sounds congested. No distress. Speaking in complete sentences. Dry cough during visit 1-2 times.    Assessment and Plan:  Acute viral vs allergy appearing symptoms x 3-4 days. Refusing  Covid-19 testing as she believes that she does not have Covid, has had 2 Covid-19 vaccines.  She sounds congested via phone but is speaking in complete sentences and is in no distress.  Given retinal surgeries we need to keep her cough under control. I will send some Tessalon Perles and refill her Tussionex. We discussed other conservative treatment.  She will call on January 25th if symptoms are no better and/or if symptoms are worse. At that time we will consider other treatment options.  Follow Up Instructions:  You may take Benzonatate capsules for cough. Take 1 capsule by mouth three times daily as needed for cough.  You may take the cough suppressant every 12 hours as needed for cough and rest. Caution this medication contains codeine which may cause drowsiness.   Be sure to hydrate well.  Please call me on the 25th if symptoms have not improved and/or if symptoms are much worse.  It was a pleasure meeting you! Allie Bossier, NP-C    I discussed the assessment and treatment plan with the patient. The patient was provided an opportunity to ask questions and all were answered. The patient agreed with the plan and demonstrated an understanding of the instructions.   The patient was advised to call back or seek an in-person evaluation if the symptoms worsen or if the condition fails to improve as anticipated.    Pleas Koch, NP    Review of Systems  Constitutional: Negative for chills, fatigue and fever.  HENT: Positive for congestion  and postnasal drip.   Respiratory: Positive for cough. Negative for shortness of breath.   Gastrointestinal: Negative for diarrhea.  Neurological: Positive for headaches.       Past Medical History:  Diagnosis Date   Abdominal or pelvic swelling, mass or lump, unspecified site    Abnormal weight gain    Acute upper respiratory infections of unspecified site    Acute venous embolism and thrombosis of unspecified deep vessels of  lower extremity    Anxiety    Asymptomatic varicose veins    Basal cell carcinoma 02/2011   nose   Encounter for therapeutic drug monitoring    Enlargement of lymph nodes    Irritable bowel syndrome    Long term (current) use of anticoagulants    Mature cystic teratoma    Morbid obesity with BMI of 40.0-44.9, adult ([facility])    Neuropathy    pinched nerve left hip/leg   Obesity    Other and unspecified special symptom or syndrome, not elsewhere classified    Other congenital anomaly of toes    Phlebitis and thrombophlebitis of superficial vessels of lower extremities    Right leg DVT 09/27/09   anticoagulation with coumadin possibly until 3/12   Strain of right Achilles tendon 8/11   per [place] [facility]-no tear     Social History   Socioeconomic History   Marital status: Married    Spouse name: Not on file   Number of children: 2   Years of education: 16   Highest education level: Not on file  Occupational History   Occupation: [employer] consultant  Tobacco Use   Smoking status: Never Smoker   Smokeless tobacco: Never Used  Scientific laboratory technician Use: Never used  Substance and Sexual Activity   Alcohol use: No   Drug use: No   Sexual activity: Not Currently    Partners: Male    Birth control/protection: Surgical  Other Topics Concern   Not on file  Social History Narrative   Remarried summer of 1997   2 daughters   Social Determinants of Health   Financial Resource Strain: Not on file  Food Insecurity: Not on file  Transportation Needs: Not on file  Physical Activity: Not on file  Stress: Not on file  Social Connections: Not on file  Intimate Partner Violence: Not on file    Past Surgical History:  Procedure Laterality Date   CATARACT EXTRACTION, BILATERAL  06/2008   FLEXIBLE SIGMOIDOSCOPY  2003   Mild active colitis (Dr. Vira Agar)   [place]  02/2011   MOH's procedure for skin cancer on nose at [place]     x2 (last  1991)   PRE-MALIGNANT / BENIGN SKIN LESION EXCISION     left temporal area   RETINAL DETACHMENT SURGERY  2021   TOTAL ABDOMINAL HYSTERECTOMY W/ BILATERAL SALPINGOOPHORECTOMY  12/2007   TAH-BSO mature cystic teratoma   VARICOSE VEIN SURGERY     sclerotherapy    Family History  Problem Relation Age of Onset   Coronary artery disease Father        s/p CABG and valve replacement and repair of heart aneurysm 2012   Heart disease Father    Stroke Father        Had sepsis from liver abscess   Atrial fibrillation Mother    Deep vein thrombosis Mother    Clotting disorder Mother    Hypertension Mother    Breast cancer Cousin 61  maternal   Stomach cancer Paternal Grandmother 53   Colon cancer Neg Hx     Allergies  Allergen Reactions   Neomycin-Bacitracin Zn-Polymyx Hives   Tape Swelling and Rash    Rash/BLISTERS   Wellbutrin [Bupropion] Swelling   Augmentin [Amoxicillin-Pot Clavulanate] Other (See Comments)    Vomiting- not a PCN or amoxil allergy   Prednisone Itching    Current Outpatient Medications on File Prior to Visit  Medication Sig Dispense Refill   aspirin EC 81 MG tablet Take by mouth.     citalopram (CELEXA) 20 MG tablet Take 1 tablet (20 mg total) by mouth daily. 90 tablet 3   docusate sodium (COLACE) 100 MG capsule Take 100 mg by mouth daily as needed for mild constipation.     Multiple Vitamin (MULTIVITAMIN) tablet Take 1 tablet by mouth daily.     No current facility-administered medications on file prior to visit.    Ht 5' 7.5" (1.715 m)    Wt 290 lb (131.5 kg)    BMI 44.75 kg/m    Objective:   Physical Exam Constitutional:      General: She is not in acute distress. HENT:     Nose:     Comments: Sounds congested  Pulmonary:     Effort: Pulmonary effort is normal.     Comments: Dry cough noted 1-2 times during visit. Neurological:     Mental Status: She is alert and oriented to person, place, and time.  Psychiatric:         Mood and Affect: Mood normal.            Assessment & Plan:

## 2020-02-17 NOTE — Assessment & Plan Note (Signed)
Acute viral vs allergy appearing symptoms x 3-4 days. Refusing Covid-19 testing as she believes that she does not have Covid, has had 2 Covid-19 vaccines.  She sounds congested via phone but is speaking in complete sentences and is in no distress.  Given retinal surgeries we need to keep her cough under control. I will send some Tessalon Perles and refill her Tussionex. We discussed other conservative treatment.  She will call on January 25th if symptoms are no better and/or if symptoms are worse. At that time we will consider other treatment options.

## 2020-02-21 ENCOUNTER — Telehealth: Payer: Self-pay | Admitting: Primary Care

## 2020-02-21 DIAGNOSIS — R059 Cough, unspecified: Secondary | ICD-10-CM

## 2020-02-21 NOTE — Telephone Encounter (Signed)
Pt called with update. She states she is not any better and she is wondering if you can send in an antibiotic for her. She is requesting ZPak or Tussionex. She has asked for a call to let her know if anything is able to be sent in.

## 2020-02-21 NOTE — Telephone Encounter (Signed)
What symptoms is she having now? What is better? What is worse?

## 2020-02-21 NOTE — Telephone Encounter (Signed)
What symptoms is she having now? Chest congestion, productive cough, coughing up green/ brown phlegm What is better? nothing What is worse? Chest congestion, productive cough, coughing up green/ brown phlegm   She is also asking for a refill of her tussionex cough syrup   [facility]

## 2020-02-22 MED ORDER — HYDROCOD POLST-CPM POLST ER 10-8 MG/5ML PO SUER: 5.0000 mL | Freq: Two times a day (BID) | ORAL | 0 refills | Status: DC | PRN

## 2020-02-22 NOTE — Telephone Encounter (Signed)
If the cough is improving some with Tussionex then I think it makes sense to continue with that as planned.  I think the recommendations to get Covid test and x-ray make complete sense and I support the notes/recommendations from [place].  I am glad the patient is vaccinated.  If she has less sputum I would be inclined not to put her on antibiotics preemptively.  I thank all involved.

## 2020-02-22 NOTE — Telephone Encounter (Signed)
Noted, will refill Tussionex one final time. I recommend xray and Covid testing for which she has refused several times.   C[place]ie End, never smoker, take a look at my note.  She didn't appear to have a bacterial cause for symptoms during our visit.  Let me know what you think.

## 2020-02-22 NOTE — Telephone Encounter (Addendum)
Noted. Shapale, you have an incomplete note open so I cannot sign her encounter. Please close.

## 2020-02-22 NOTE — Telephone Encounter (Signed)
Please thank her for the update. Did she ever tested for Covid?  I still recommend it. Can we also have her go to [facility] outpatient imaging center for a chest x-ray?  Would like to take a better look at her lungs.  So if nothing is better, does the Tussionex even help? If so I will send a final refill.   Any fevers?

## 2020-02-22 NOTE — Telephone Encounter (Signed)
Called patient refused to get tested or xray.  Cough is the same but not as productive. States that she did have improvement with the tussionex. She did get message from pharmacy just a little bit ago that the tessalon pearls are ready for pick up have been on back order. Would like refill on tussionex as well. She denies any fever.

## 2020-02-28 DIAGNOSIS — H33001 Unspecified retinal detachment with retinal break, right eye: Secondary | ICD-10-CM | POA: Diagnosis not present

## 2020-04-06 DIAGNOSIS — H33001 Unspecified retinal detachment with retinal break, right eye: Secondary | ICD-10-CM | POA: Diagnosis not present

## 2020-04-23 ENCOUNTER — Ambulatory Visit: Payer: Federal, State, Local not specified - PPO | Admitting: Podiatry

## 2020-05-04 LAB — COLOGUARD

## 2020-05-07 ENCOUNTER — Telehealth: Payer: Self-pay

## 2020-05-07 ENCOUNTER — Ambulatory Visit (INDEPENDENT_AMBULATORY_CARE_PROVIDER_SITE_OTHER): Payer: Federal, State, Local not specified - PPO

## 2020-05-07 ENCOUNTER — Ambulatory Visit: Payer: Federal, State, Local not specified - PPO | Admitting: Podiatry

## 2020-05-07 ENCOUNTER — Other Ambulatory Visit: Payer: Self-pay

## 2020-05-07 ENCOUNTER — Encounter: Payer: Self-pay | Admitting: Podiatry

## 2020-05-07 DIAGNOSIS — M722 Plantar fascial fibromatosis: Secondary | ICD-10-CM | POA: Diagnosis not present

## 2020-05-07 NOTE — Telephone Encounter (Signed)
[facility] sent Pt Report stating her order for Cologuard has expired.  Please readdress at next Annual.

## 2020-05-07 NOTE — Telephone Encounter (Signed)
Called and left voicemail for patient to call back to be scheduled. 

## 2020-05-07 NOTE — Patient Instructions (Signed)
[facility] - Timoteo Gaul, MD

## 2020-05-07 NOTE — Progress Notes (Signed)
She presents today with chief complaint of a small painful bump in her arches and sometimes is bothersome but is not overly painful.  She states been there for the past 2 to 3 weeks she says there is some increased pain with it with a lot of walking or friction on the arch.  Denies any trauma.  Objective: Pulses remain strong and palpable.  She has a small BB type lesion measuring about 2 mm in diameter along the medial longitudinal arch and the medial band of the plantar fascia just proximal to the sesamoids.  She does demonstrate some motion in the lesion so it is very similar to these small pacinian type lesions that we have seen before.  I do not think that this is a plantar fibroma a marker today demonstrated no foreign object just a slight increase in density.  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  If it grows larger than a centimeter we will injected with Kenalog she understands this and is amenable to it.  No similar lesions found on the contralateral foot

## 2020-05-25 ENCOUNTER — Encounter: Payer: Self-pay | Admitting: Obstetrics and Gynecology

## 2020-05-25 ENCOUNTER — Other Ambulatory Visit: Payer: Self-pay

## 2020-05-25 ENCOUNTER — Ambulatory Visit (INDEPENDENT_AMBULATORY_CARE_PROVIDER_SITE_OTHER): Payer: Federal, State, Local not specified - PPO | Admitting: Obstetrics and Gynecology

## 2020-05-25 VITALS — BP 110/72 | Ht 67.5 in | Wt 288.0 lb

## 2020-05-25 DIAGNOSIS — Z131 Encounter for screening for diabetes mellitus: Secondary | ICD-10-CM

## 2020-05-25 DIAGNOSIS — Z01419 Encounter for gynecological examination (general) (routine) without abnormal findings: Secondary | ICD-10-CM | POA: Diagnosis not present

## 2020-05-25 DIAGNOSIS — Z1231 Encounter for screening mammogram for malignant neoplasm of breast: Secondary | ICD-10-CM

## 2020-05-25 DIAGNOSIS — Z0000 Encounter for general adult medical examination without abnormal findings: Secondary | ICD-10-CM

## 2020-05-25 DIAGNOSIS — Z1211 Encounter for screening for malignant neoplasm of colon: Secondary | ICD-10-CM | POA: Diagnosis not present

## 2020-05-25 DIAGNOSIS — R7303 Prediabetes: Secondary | ICD-10-CM

## 2020-05-25 NOTE — Progress Notes (Signed)
Gynecology Annual Exam  PCP: Tonia Ghent, MD  Chief Complaint:  Chief Complaint  Patient presents with  . Gynecologic Exam    No concerns    History of Present Illness: Patient is a 60 y.o. G2P2002 presents for annual exam. The patient has no complaints today.   LMP: No LMP recorded. Patient has had a hysterectomy. She denies postmenopausal bleeding or spotting  The patient is not currently sexually active. She denies dyspareunia.  Postcoital Bleeding: not applicable   The patient does perform self breast exams.  There is no notable family history of breast or ovarian cancer in her family.  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  The patient denies current symptoms of depression.   Review of Systems: Review of Systems  Constitutional: Negative for chills, fever, malaise/fatigue and weight loss.  HENT: Negative for congestion, hearing loss and sinus pain.   Eyes: Negative for blurred vision and double vision.  Respiratory: Negative for cough, sputum production, shortness of breath and wheezing.   Cardiovascular: Negative for chest pain, palpitations, orthopnea and leg swelling.  Gastrointestinal: Negative for abdominal pain, constipation, diarrhea, nausea and vomiting.  Genitourinary: Negative for dysuria, flank pain, frequency, hematuria and urgency.  Musculoskeletal: Negative for back pain, falls and joint pain.  Skin: Negative for itching and rash.  Neurological: Negative for dizziness and headaches.  Psychiatric/Behavioral: Negative for depression, substance abuse and suicidal ideas. The patient is not nervous/anxious.     Past Medical History:  Past Medical History:  Diagnosis Date  . Abdominal or pelvic swelling, mass or lump, unspecified site   . Abnormal weight gain   . Acute upper respiratory infections of unspecified site   . Acute venous embolism and thrombosis of unspecified deep vessels of lower extremity    . Anxiety   . Asymptomatic varicose veins   . Basal cell carcinoma 02/2011   nose  . Encounter for therapeutic drug monitoring   . Enlargement of lymph nodes   . Irritable bowel syndrome   . Long term (current) use of anticoagulants   . Mature cystic teratoma   . Morbid obesity with BMI of 40.0-44.9, adult ([place])   . Neuropathy    pinched nerve left hip/leg  . Obesity   . Other and unspecified special symptom or syndrome, not elsewhere classified   . Other congenital anomaly of toes   . Phlebitis and thrombophlebitis of superficial vessels of lower extremities   . Right leg DVT 09/27/09   anticoagulation with coumadin possibly until 3/12  . Strain of right Achilles tendon 8/11   per [place] [facility]-no tear    Past Surgical History:  Past Surgical History:  Procedure Laterality Date  . CATARACT EXTRACTION, BILATERAL  06/2008  . FLEXIBLE SIGMOIDOSCOPY  2003   Mild active colitis (Dr. Vira Agar)  . MOHS SURGERY  02/2011   MOH's procedure for skin cancer on nose at [facility]  . NSVD     x2 (last 1991)  . PRE-MALIGNANT / BENIGN SKIN LESION EXCISION     left temporal area  . RETINAL DETACHMENT SURGERY  2021  . TOTAL ABDOMINAL HYSTERECTOMY W/ BILATERAL SALPINGOOPHORECTOMY  12/2007   TAH-BSO mature cystic teratoma  . VARICOSE VEIN SURGERY     sclerotherapy    Gynecologic History:  No LMP recorded. Patient has had a hysterectomy. Last Pap:2021 NIL Last mammogram: 2021  Results were: BI-RAD I  Obstetric History: [id number]  Family History:  Family History  Problem Relation Age of Onset  . Coronary artery disease Father        s/p CABG and valve replacement and repair of heart aneurysm 2012  . Heart disease Father   . Stroke Father        Had sepsis from liver abscess  . Atrial fibrillation Mother   . Deep vein thrombosis Mother   . Clotting disorder Mother   . Hypertension Mother   . Breast cancer Cousin 40       maternal  . Stomach cancer Paternal Grandmother 81  . Colon cancer  Neg Hx     Social History:  Social History   Socioeconomic History  . Marital status: Married    Spouse name: Not on file  . Number of children: 2  . Years of education: 56  . Highest education level: Not on file  Occupational History  . Occupation: [employer] consultant  Tobacco Use  . Smoking status: Never Smoker  . Smokeless tobacco: Never Used  Vaping Use  . Vaping Use: Never used  Substance and Sexual Activity  . Alcohol use: No  . Drug use: No  . Sexual activity: Not Currently    Partners: Male    Birth control/protection: Surgical    Comment: Hysterectomy  Other Topics Concern  . Not on file  Social History Narrative   Remarried summer of 1997   2 daughters   Social Determinants of Health   Financial Resource Strain: Not on file  Food Insecurity: Not on file  Transportation Needs: Not on file  Physical Activity: Not on file  Stress: Not on file  Social Connections: Not on file  Intimate Partner Violence: Not on file    Allergies:  Allergies  Allergen Reactions  . Neomycin-Bacitracin Zn-Polymyx Hives  . Tape Swelling and Rash    Rash/BLISTERS  . Wellbutrin [Bupropion] Swelling  . Augmentin [Amoxicillin-Pot Clavulanate] Other (See Comments)    Vomiting- not a PCN or amoxil allergy  . Wound Dressing Adhesive     Any spray adhesive too  . Prednisone Itching    Medications: Prior to Admission medications   Medication Sig Start Date End Date Taking? Authorizing Provider  aspirin EC 81 MG tablet Take by mouth.   Yes [provider]  B Complex Vitamins (B COMPLEX PO) Take by mouth.   Yes [provider]  citalopram (CELEXA) 20 MG tablet Take 1 tablet (20 mg total) by mouth daily. 05/05/19  Yes Dalia Heading, CNM  docusate sodium (COLACE) 100 MG capsule Take 100 mg by mouth daily as needed for mild constipation.   Yes [provider]  Multiple Vitamin (MULTIVITAMIN) tablet Take 1 tablet by mouth daily.   Yes [provider]  VITAMIN D PO Take by mouth.   Yes [provider]    Physical Exam Vitals: Blood pressure 110/72, height 5' 7.5" (1.715 m), weight 288 lb (130.6 kg).  Physical Exam Constitutional:      Appearance: She is well-developed.  Genitourinary:     Genitourinary Comments: External: Normal appearing vulva. No lesions noted.   Bimanual examination: Uterus and cervix absent. No adnexal masses. No adnexal tenderness. Pelvis not fixed.  Breast Exam: breast equal without skin changes, nipple discharge, breast lump or enlarged lymph nodes   HENT:     Head: Normocephalic and atraumatic.  Neck:     Thyroid: No thyromegaly.  Cardiovascular:     Rate and Rhythm: Normal rate and regular rhythm.     Heart  sounds: Normal heart sounds.  Pulmonary:     Effort: Pulmonary effort is normal.     Breath sounds: Normal breath sounds.  Abdominal:     General: Bowel sounds are normal. There is no distension.     Palpations: Abdomen is soft. There is no mass.  Musculoskeletal:     Cervical back: Neck supple.  Neurological:     Mental Status: She is alert and oriented to person, place, and time.  Skin:    General: Skin is warm and dry.  Psychiatric:        Behavior: Behavior normal.        Thought Content: Thought content normal.        Judgment: Judgment normal.  Vitals reviewed.      Female chaperone present for pelvic and breast  portions of the physical exam  Assessment: 60 y.o. G2P2002 routine annual exam  Plan: Problem List Items Addressed This Visit   None   Visit Diagnoses    Encounter for annual routine gynecological examination    -  Primary   Health maintenance examination       Breast cancer screening by mammogram       Relevant Orders   MM 3D SCREEN BREAST BILATERAL   Colon cancer screening       Relevant Orders   Cologuard   Screening for diabetes mellitus       Relevant Orders   Hemoglobin A1c   Encounter for gynecological examination without  abnormal finding       Prediabetes       Relevant Orders   Hemoglobin A1c      1) Mammogram - recommend yearly screening mammogram.  Mammogram was ordered today  2) STI screening was offered and declined  3) ASCCP guidelines and rational discussed.  Patient opts for every 3 years screening interval. Consider discontinuing because of history of hysterectoym  4) Cologuard ordered.  5) Routine healthcare maintenance including cholesterol, diabetes screening discussed. She was prediabetic last year. She generally has labs done at work which she will bring me a copy of.   6) Osteoporosis screening - no increased risk factors for early screening at this time.  Adrian Prows MD, [facility], [facility] 05/25/2020 3:52 PM

## 2020-05-25 NOTE — Patient Instructions (Addendum)
[facility] [place] for Calcium and Vitamin D  Age (yr) Calcium Recommended Dietary Allowance (mg/day) Vitamin D Recommended Dietary Allowance (international units/day)  9-18 1,300 600  19-50 1,000 600  51-70 1,200 600  71 and older 1,200 800  Data from Institute of Medicine. Dietary reference intakes: calcium, vitamin D. [place], [place]: [employer]; 2011.      Diabetes Care, 44(Suppl 1), H85-I77. [email or web address]  Prediabetes Prediabetes is when your blood sugar (blood glucose) level is higher than normal but not high enough for you to be diagnosed with type 2 diabetes. Having prediabetes puts you at risk for developing type 2 diabetes (type 2 diabetes mellitus). With certain lifestyle changes, you may be able to prevent or delay the onset of type 2 diabetes. This is important because type 2 diabetes can lead to serious complications, such as:  Heart disease.  Stroke.  Blindness.  Kidney disease.  Depression.  Poor circulation in the feet and legs. In severe cases, this could lead to surgical removal of a leg (amputation). What are the causes? The exact cause of prediabetes is not known. It may result from insulin resistance. Insulin resistance develops when cells in the body do not respond properly to insulin that the body makes. This can cause excess glucose to build up in the blood. High blood glucose (hyperglycemia) can develop. What increases the risk? The following factors may make you more likely to develop this condition:  You have a family member with type 2 diabetes.  You are older than 45 years.  You had a temporary form of diabetes during a pregnancy (gestational diabetes).  You had polycystic ovary syndrome (PCOS).  You are overweight or obese.  You are inactive (sedentary).  You have a history of heart disease, including problems with cholesterol levels, high levels  of blood fats, or high blood pressure. What are the signs or symptoms? You may have no symptoms. If you do have symptoms, they may include:  Increased hunger.  Increased thirst.  Increased urination.  Vision changes, such as blurry vision.  Tiredness (fatigue). How is this diagnosed? This condition can be diagnosed with blood tests. Your blood glucose may be checked with one or more of the following tests:  A fasting blood glucose (FBG) test. You will not be allowed to eat (you will fast) for at least 8 hours before a blood sample is taken.  An A1C blood test (hemoglobin A1C). This test provides information about blood glucose levels over the previous 2?3 months.  An oral glucose tolerance test (OGTT). This test measures your blood glucose at two points in time: ? After fasting. This is your baseline level. ? Two hours after you drink a beverage that contains glucose. You may be diagnosed with prediabetes if:  Your FBG is 100?125 mg/dL (5.6-6.9 mmol/L).  Your A1C level is 5.7?6.4% (39-46 mmol/mol).  Your OGTT result is 140?199 mg/dL (7.8-11 mmol/L). These blood tests may be repeated to confirm your diagnosis.   How is this treated? Treatment may include dietary and lifestyle changes to help lower your blood glucose and prevent type 2 diabetes from developing. In some cases, medicine may be prescribed to help lower the risk of type 2 diabetes. Follow these instructions at home: Nutrition  Follow a healthy meal plan. This includes eating lean proteins, whole grains, legumes, fresh fruits and vegetables, low-fat dairy products, and healthy fats.  Follow instructions from your health care provider about eating or drinking restrictions.  Meet with  a dietitian to create a healthy eating plan that is right for you.   Lifestyle  Do moderate-intensity exercise for at least 30 minutes a day on 5 or more days each week, or as told by your health care provider. A mix of activities may  be best, such as: ? Brisk walking, swimming, biking, and weight lifting.  Lose weight as told by your health care provider. Losing 5-7% of your body weight can reverse insulin resistance.  Do not drink alcohol if: ? Your health care provider tells you not to drink. ? You are pregnant, may be pregnant, or are planning to become pregnant.  If you drink alcohol: ? Limit how much you use to:  0-1 drink a day for women.  0-2 drinks a day for men. ? Be aware of how much alcohol is in your drink. In the [place] one drink equals one 12 oz bottle of beer (355 mL), one 5 oz glass of wine (148 mL), or one 1 oz glass of hard liquor (44 mL). General instructions  Take over-the-counter and prescription medicines only as told by your health care provider. You may be prescribed medicines that help lower the risk of type 2 diabetes.  Do not use any products that contain nicotine or tobacco, such as cigarettes, e-cigarettes, and chewing tobacco. If you need help quitting, ask your health care provider.  Keep all follow-up visits. This is important. Where to find more information  American Diabetes Association: www.diabetes.org  Academy of Nutrition and Dietetics: www.eatright.org  American Heart Association: www.heart.org Contact a health care provider if:  You have any of these symptoms: ? Increased hunger. ? Increased urination. ? Increased thirst. ? Fatigue. ? Vision changes, such as blurry vision. Get help right away if you:  Have shortness of breath.  Feel confused.  Vomit or feel like you may vomit. Summary  Prediabetes is when your blood sugar (blood glucose)level is higher than normal but not high enough for you to be diagnosed with type 2 diabetes.  Having prediabetes puts you at risk for developing type 2 diabetes (type 2 diabetes mellitus).  Make lifestyle changes such as eating a healthy diet and exercising regularly to help prevent diabetes. Lose weight as told by your  health care provider. This information is not intended to replace advice given to you by your health care provider. Make sure you discuss any questions you have with your health care provider. Document Revised: 04/14/2019 Document Reviewed: 04/14/2019 Elsevier Patient Education  2021 [place].  Prediabetes Eating Plan Prediabetes is a condition that causes blood sugar (glucose) levels to be higher than normal. This increases the risk for developing type 2 diabetes (type 2 diabetes mellitus). Working with a health care provider or nutrition specialist (dietitian) to make diet and lifestyle changes can help prevent the onset of diabetes. These changes may help you:  Control your blood glucose levels.  Improve your cholesterol levels.  Manage your blood pressure. What are tips for following this plan? Reading food labels  Read food labels to check the amount of fat, salt (sodium), and sugar in prepackaged foods. Avoid foods that have: ? Saturated fats. ? Trans fats. ? Added sugars.  Avoid foods that have more than 300 milligrams (mg) of sodium per serving. Limit your sodium intake to less than 2,300 mg each day. Shopping  Avoid buying pre-made and processed foods.  Avoid buying drinks with added sugar. Cooking  Cook with olive oil. Do not use butter, lard, or ghee.  Bake, broil, grill, steam, or boil foods. Avoid frying. Meal planning  Work with your dietitian to create an eating plan that is right for you. This may include tracking how many calories you take in each day. Use a food diary, notebook, or mobile application to track what you eat at each meal.  Consider following a Mediterranean diet. This includes: ? Eating several servings of fresh fruits and vegetables each day. ? Eating fish at least twice a week. ? Eating one serving each day of whole grains, beans, nuts, and seeds. ? Using olive oil instead of other fats. ? Limiting alcohol. ? Limiting red meat. ? Using  nonfat or low-fat dairy products.  Consider following a plant-based diet. This includes dietary choices that focus on eating mostly vegetables and fruit, grains, beans, nuts, and seeds.  If you have high blood pressure, you may need to limit your sodium intake or follow a diet such as the DASH (Dietary Approaches to Stop Hypertension) eating plan. The DASH diet aims to lower high blood pressure.   Lifestyle  Set weight loss goals with help from your health care team. It is recommended that most people with prediabetes lose 7% of their body weight.  Exercise for at least 30 minutes 5 or more days a week.  Attend a support group or seek support from a mental health counselor.  Take over-the-counter and prescription medicines only as told by your health care provider. What foods are recommended? Fruits Berries. Bananas. Apples. Oranges. Grapes. Papaya. Mango. Pomegranate. Kiwi. Grapefruit. Cherries. Vegetables Lettuce. Spinach. Peas. Beets. Cauliflower. Cabbage. Broccoli. Carrots. Tomatoes. Squash. Eggplant. Herbs. Peppers. Onions. Cucumbers. Brussels sprouts. Grains Whole grains, such as whole-wheat or whole-grain breads, crackers, cereals, and pasta. Unsweetened oatmeal. Bulgur. Barley. Quinoa. Brown rice. Corn or whole-wheat flour tortillas or taco shells. Meats and other proteins Seafood. Poultry without skin. Lean cuts of pork and beef. Tofu. Eggs. Nuts. Beans. Dairy Low-fat or fat-free dairy products, such as yogurt, cottage cheese, and cheese. Beverages Water. Tea. Coffee. Sugar-free or diet soda. Seltzer water. Low-fat or nonfat milk. Milk alternatives, such as soy or almond milk. Fats and oils Olive oil. Canola oil. Sunflower oil. Grapeseed oil. Avocado. Walnuts. Sweets and desserts Sugar-free or low-fat pudding. Sugar-free or low-fat ice cream and other frozen treats. Seasonings and condiments Herbs. Sodium-free spices. Mustard. Relish. Low-salt, low-sugar ketchup. Low-salt,  low-sugar barbecue sauce. Low-fat or fat-free mayonnaise. The items listed above may not be a complete list of recommended foods and beverages. Contact a dietitian for more information. What foods are not recommended? Fruits Fruits canned with syrup. Vegetables Canned vegetables. Frozen vegetables with butter or cream sauce. Grains Refined white flour and flour products, such as bread, pasta, snack foods, and cereals. Meats and other proteins Fatty cuts of meat. Poultry with skin. Breaded or fried meat. Processed meats. Dairy Full-fat yogurt, cheese, or milk. Beverages Sweetened drinks, such as iced tea and soda. Fats and oils Butter. Lard. Ghee. Sweets and desserts Baked goods, such as cake, cupcakes, pastries, cookies, and cheesecake. Seasonings and condiments Spice mixes with added salt. Ketchup. Barbecue sauce. Mayonnaise. The items listed above may not be a complete list of foods and beverages that are not recommended. Contact a dietitian for more information. Where to find more information  American Diabetes Association: www.diabetes.org Summary  You may need to make diet and lifestyle changes to help prevent the onset of diabetes. These changes can help you control blood sugar, improve cholesterol levels, and manage blood pressure.  Set  weight loss goals with help from your health care team. It is recommended that most people with prediabetes lose 7% of their body weight.  Consider following a Mediterranean diet. This includes eating plenty of fresh fruits and vegetables, whole grains, beans, nuts, seeds, fish, and low-fat dairy, and using olive oil instead of other fats. This information is not intended to replace advice given to you by your health care provider. Make sure you discuss any questions you have with your health care provider. Document Revised: 04/14/2019 Document Reviewed: 04/14/2019 Elsevier Patient Education  2021 [employer].   Exercising to Stay  Healthy To become healthy and stay healthy, it is recommended that you do moderate-intensity and vigorous-intensity exercise. You can tell that you are exercising at a moderate intensity if your heart starts beating faster and you start breathing faster but can still hold a conversation. You can tell that you are exercising at a vigorous intensity if you are breathing much harder and faster and cannot hold a conversation while exercising. Exercising regularly is important. It has many health benefits, such as:  Improving overall fitness, flexibility, and endurance.  Increasing bone density.  Helping with weight control.  Decreasing body fat.  Increasing muscle strength.  Reducing stress and tension.  Improving overall health. How often should I exercise? Choose an activity that you enjoy, and set realistic goals. Your health care provider can help you make an activity plan that works for you. Exercise regularly as told by your health care provider. This may include:  Doing strength training two times a week, such as: ? Lifting weights. ? Using resistance bands. ? Push-ups. ? Sit-ups. ? Yoga.  Doing a certain intensity of exercise for a given amount of time. Choose from these options: ? A total of 150 minutes of moderate-intensity exercise every week. ? A total of 75 minutes of vigorous-intensity exercise every week. ? A mix of moderate-intensity and vigorous-intensity exercise every week. Children, pregnant women, people who have not exercised regularly, people who are overweight, and older adults may need to talk with a health care provider about what activities are safe to do. If you have a medical condition, be sure to talk with your health care provider before you start a new exercise program. What are some exercise ideas? Moderate-intensity exercise ideas include:  Walking 1 mile (1.6 km) in about 15 minutes.  Biking.  Hiking.  Golfing.  Dancing.  Water  aerobics. Vigorous-intensity exercise ideas include:  Walking 4.5 miles (7.2 km) or more in about 1 hour.  Jogging or running 5 miles (8 km) in about 1 hour.  Biking 10 miles (16.1 km) or more in about 1 hour.  Lap swimming.  Roller-skating or in-line skating.  Cross-country skiing.  Vigorous competitive sports, such as football, basketball, and soccer.  Jumping rope.  Aerobic dancing.   What are some everyday activities that can help me to get exercise?  [place] work, such as: ? Pushing a Conservation officer, nature. ? Raking and bagging leaves.  Washing your car.  Pushing a stroller.  Shoveling snow.  Gardening.  Washing windows or floors. How can I be more active in my day-to-day activities?  Use stairs instead of an elevator.  Take a walk during your lunch break.  If you drive, park your car farther away from your work or school.  If you take public transportation, get off one stop early and walk the rest of the way.  Stand up or walk around during all of your indoor  phone calls.  Get up, stretch, and walk around every 30 minutes throughout the day.  Enjoy exercise with a friend. Support to continue exercising will help you keep a regular routine of activity. What guidelines can I follow while exercising?  Before you start a new exercise program, talk with your health care provider.  Do not exercise so much that you hurt yourself, feel dizzy, or get very short of breath.  Wear comfortable clothes and wear shoes with good support.  Drink plenty of water while you exercise to prevent dehydration or heat stroke.  Work out until your breathing and your heartbeat get faster. Where to find more information  U.S. Department of Health and Human Services: [employer]at  Centers for Disease Control and Prevention (CDC): http://www.wolf.info/ Summary  Exercising regularly is important. It will improve your overall fitness, flexibility, and endurance.  Regular exercise also will improve  your overall health. It can help you control your weight, reduce stress, and improve your bone density.  Do not exercise so much that you hurt yourself, feel dizzy, or get very short of breath.  Before you start a new exercise program, talk with your health care provider. This information is not intended to replace advice given to you by your health care provider. Make sure you discuss any questions you have with your health care provider. Document Revised: 12/26/2016 Document Reviewed: 12/04/2016 Elsevier Patient Education  2021 [employer].   Budget-Friendly Healthy Eating There are many ways to save money at the grocery store and continue to eat healthy. You can be successful if you:  Plan meals according to your budget.  Make a grocery list and only purchase food according to your grocery list.  Prepare food yourself at home. What are tips for following this plan? Reading food labels  Compare food labels between brand name foods and the store brand. Often the nutritional value is the same, but the store brand is lower cost.  Look for products that do not have added sugar, fat, or salt (sodium). These often cost the same but are healthier for you. Products may be labeled as: ? Sugar-free. ? Nonfat. ? Low-fat. ? Sodium-free. ? Low-sodium.  Look for lean ground beef labeled as at least 92% lean and 8% fat. Shopping  Buy only the items on your grocery list and go only to the areas of the store that have the items on your list.  Use coupons only for foods and brands you normally buy. Avoid buying items you wouldn't normally buy simply because they are on sale.  Check online and in newspapers for weekly deals.  Buy healthy items from the bulk bins when available, such as herbs, spices, flour, pasta, nuts, and dried fruit.  Buy fruits and vegetables that are in season. Prices are usually lower on in-season produce.  Look at the unit price on the price tag. Use it to compare  different brands and sizes to find out which item is the best deal.  Choose healthy items that are often low-cost, such as carrots, potatoes, apples, bananas, and oranges. Dried or canned beans are a low-cost protein source.  Buy in bulk and freeze extra food. Items you can buy in bulk include meats, fish, poultry, frozen fruits, and frozen vegetables.  Avoid buying "ready-to-eat" foods, such as pre-cut fruits and vegetables and pre-made salads.  If possible, shop around to discover where you can find the best prices. Consider other retailers such as dollar stores, larger wholesale stores, local fruit and  vegetable stands, and farmers markets.  Do not shop when you are hungry. If you shop while hungry, it may be hard to stick to your list and budget.  Resist impulse buying. Use your grocery list as your official plan for the week.  Buy a variety of vegetables and fruits by purchasing fresh, frozen, and canned items.  Look at the top and bottom shelves for deals. Foods at eye level (eye level of an adult or child) are usually more expensive.  Be efficient with your time when shopping. The more time you spend at the store, the more money you are likely to spend.  To save money when choosing more expensive foods like meats and dairy: ? Choose cheaper cuts of meat, such as bone-in chicken thighs and drumsticks instead of skinless and boneless chicken. When you are ready to prepare the chicken, you can remove the skin yourself to make it healthier. ? Choose lean meats like chicken or [place] instead of beef. ? Choose canned seafood, such as tuna, salmon, or sardines. ? Buy eggs as a low-cost source of protein. ? Buy dried beans and peas, such as lentils, split peas, or kidney beans instead of meats. Dried beans and peas are a good alternative source of protein. ? Buy the larger tubs of yogurt instead of individual-sized containers.  Choose water instead of sodas and other sweetened  beverages.  Avoid buying chips, cookies, and other "junk food." These items are usually expensive and not healthy.   Cooking  Make extra food and freeze the extras in meal-sized containers or in individual portions for fast meals and snacks.  Pre-cook on days when you have extra time to prepare meals in advance. You can keep these meals in the fridge or freezer and reheat for a quick meal.  When you come home from the grocery store, wash, peel, and cut fruits and vegetables so they are ready to use and eat. This will help reduce food waste. Meal planning  Do not eat out or get fast food. Prepare food at home.  Make a grocery list and make sure to bring it with you to the store. If you have a smart phone, you could use your phone to create your shopping list.  Plan meals and snacks according to a grocery list and budget you create.  Use leftovers in your meal plan for the week.  Look for recipes where you can cook once and make enough food for two meals.  Prepare budget-friendly types of meals like stews, casseroles, and stir-fry dishes.  Try some meatless meals or try "no cook" meals like salads.  Make sure that half your plate is filled with fruits or vegetables. Choose from fresh, frozen, or canned fruits and vegetables. If eating canned, remember to rinse them before eating. This will remove any excess salt added for packaging. Summary  Eating healthy on a budget is possible if you plan your meals according to your budget, purchase according to your budget and grocery list, and prepare food yourself.  Tips for buying more food on a limited budget include buying generic brands, using coupons only for foods you normally buy, and buying healthy items from the bulk bins when available.  Tips for buying cheaper food to replace expensive food include choosing cheaper, lean cuts of meat, and buying dried beans and peas. This information is not intended to replace advice given to you by  your health care provider. Make sure you discuss any questions you have with  your health care provider. Document Revised: 10/27/2019 Document Reviewed: 10/27/2019 Elsevier Patient Education  [place] protect organs, store calcium, anchor muscles, and support the whole body. Keeping your bones strong is important, especially as you get older. You can take actions to help keep your bones strong and healthy. Why is keeping my bones healthy important? Keeping your bones healthy is important because your body constantly replaces bone cells. Cells get old, and new cells take their place. As we age, we lose bone cells because the body may not be able to make enough new cells to replace the old cells. The amount of bone cells and bone tissue you have is referred to as bone mass. The higher your bone mass, the stronger your bones. The aging process leads to an overall loss of bone mass in the body, which can increase the likelihood of:  Joint pain and stiffness.  Broken bones.  A condition in which the bones become weak and brittle (osteoporosis). A large decline in bone mass occurs in older adults. In women, it occurs about the time of menopause.   What actions can I take to keep my bones healthy? Good health habits are important for maintaining healthy bones. This includes eating nutritious foods and exercising regularly. To have healthy bones, you need to get enough of the right minerals and vitamins. Most nutrition experts recommend getting these nutrients from the foods that you eat. In some cases, taking supplements may also be recommended. Doing certain types of exercise is also important for bone health. What are the nutritional recommendations for healthy bones? Eating a well-balanced diet with plenty of calcium and vitamin D will help to protect your bones. Nutritional recommendations vary from person to person. Ask your health care provider what is healthy for  you. Here are some general guidelines. Get enough calcium Calcium is the most important (essential) mineral for bone health. Most people can get enough calcium from their diet, but supplements may be recommended for people who are at risk for osteoporosis. Good sources of calcium include:  Dairy products, such as low-fat or nonfat milk, cheese, and yogurt.  Dark green leafy vegetables, such as bok choy and broccoli.  Calcium-fortified foods, such as orange juice, cereal, bread, soy beverages, and tofu products.  Nuts, such as almonds. Follow these recommended amounts for daily calcium intake:  Children, age 85-3: 700 mg.  Children, age 42-8: 1,000 mg.  Children, age 421-13: 1,300 mg.  Teens, age 51-18: 1,300 mg.  Adults, age 23-50: 1,000 mg.  Adults, age 83-70: ? Men: 1,000 mg. ? Women: 1,200 mg.  Adults, age 29 or older: 1,200 mg.  Pregnant and breastfeeding females: ? Teens: 1,300 mg. ? Adults: 1,000 mg. Get enough vitamin D Vitamin D is the most essential vitamin for bone health. It helps the body absorb calcium. Sunlight stimulates the skin to make vitamin D, so be sure to get enough sunlight. If you live in a cold climate or you do not get outside often, your health care provider may recommend that you take vitamin D supplements. Good sources of vitamin D in your diet include:  Egg yolks.  Saltwater fish.  Milk and cereal fortified with vitamin D. Follow these recommended amounts for daily vitamin D intake:  Children and teens, age 85-18: 600 international units.  Adults, age 74 or younger: 400-800 international units.  Adults, age 857 or older: 800-1,000 international units. Get other important nutrients Other nutrients that are important  for bone health include:  Phosphorus. This mineral is found in meat, poultry, dairy foods, nuts, and legumes. The recommended daily intake for adult men and adult women is 700 mg.  Magnesium. This mineral is found in seeds, nuts,  dark green vegetables, and legumes. The recommended daily intake for adult men is 400-420 mg. For adult women, it is 310-320 mg.  Vitamin K. This vitamin is found in green leafy vegetables. The recommended daily intake is 120 mg for adult men and 90 mg for adult women.   What type of physical activity is best for building and maintaining healthy bones? Weight-bearing and strength-building activities are important for building and maintaining healthy bones. Weight-bearing activities cause muscles and bones to work against gravity. Strength-building activities increase the strength of the muscles that support bones. Weight-bearing and muscle-building activities include:  Walking and hiking.  Jogging and running.  Dancing.  Gym exercises.  Lifting weights.  Tennis and racquetball.  Climbing stairs.  Aerobics. Adults should get at least 30 minutes of moderate physical activity on most days. Children should get at least 60 minutes of moderate physical activity on most days. Ask your health care provider what type of exercise is best for you.   How can I find out if my bone mass is low? Bone mass can be measured with an X-ray test called a bone mineral density (BMD) test. This test is recommended for all women who are age 58 or older. It may also be recommended for:  Men who are age 72 or older.  People who are at risk for osteoporosis because of: ? Having bones that break easily. ? Having a long-term disease that weakens bones, such as kidney disease or rheumatoid arthritis. ? Having menopause earlier than normal. ? Taking medicine that weakens bones, such as steroids, thyroid hormones, or hormone treatment for breast cancer or prostate cancer. ? Smoking. ? Drinking three or more alcoholic drinks a day. If you find that you have a low bone mass, you may be able to prevent osteoporosis or further bone loss by changing your diet and lifestyle. Where can I find more information? For more  information, check out the following websites:  [place]: AviationTales.fr  [employer] of Health: www.bones.SouthExposed.es  International Osteoporosis Foundation: Administrator.iofbonehealth.org Summary  The aging process leads to an overall loss of bone mass in the body, which can increase the likelihood of broken bones and osteoporosis.  Eating a well-balanced diet with plenty of calcium and vitamin D will help to protect your bones.  Weight-bearing and strength-building activities are also important for building and maintaining strong bones.  Bone mass can be measured with an X-ray test called a bone mineral density (BMD) test. This information is not intended to replace advice given to you by your health care provider. Make sure you discuss any questions you have with your health care provider. Document Revised: 02/09/2017 Document Reviewed: 02/09/2017 Elsevier Patient Education  2021 [employer].

## 2020-06-04 DIAGNOSIS — H33001 Unspecified retinal detachment with retinal break, right eye: Secondary | ICD-10-CM | POA: Diagnosis not present

## 2020-07-16 DIAGNOSIS — H3322 Serous retinal detachment, left eye: Secondary | ICD-10-CM | POA: Diagnosis not present

## 2020-07-18 DIAGNOSIS — H33002 Unspecified retinal detachment with retinal break, left eye: Secondary | ICD-10-CM | POA: Diagnosis not present

## 2020-07-18 DIAGNOSIS — H3322 Serous retinal detachment, left eye: Secondary | ICD-10-CM | POA: Diagnosis not present

## 2020-08-20 DIAGNOSIS — H3322 Serous retinal detachment, left eye: Secondary | ICD-10-CM | POA: Diagnosis not present

## 2020-08-28 DIAGNOSIS — Z713 Dietary counseling and surveillance: Secondary | ICD-10-CM | POA: Diagnosis not present

## 2020-09-24 DIAGNOSIS — Z1231 Encounter for screening mammogram for malignant neoplasm of breast: Secondary | ICD-10-CM | POA: Diagnosis not present

## 2020-09-25 ENCOUNTER — Encounter: Payer: Self-pay | Admitting: Obstetrics and Gynecology

## 2020-10-02 DIAGNOSIS — Z713 Dietary counseling and surveillance: Secondary | ICD-10-CM | POA: Diagnosis not present

## 2020-11-19 DIAGNOSIS — H3322 Serous retinal detachment, left eye: Secondary | ICD-10-CM | POA: Diagnosis not present

## 2021-02-11 DIAGNOSIS — Z0289 Encounter for other administrative examinations: Secondary | ICD-10-CM

## 2021-02-18 DIAGNOSIS — H35352 Cystoid macular degeneration, left eye: Secondary | ICD-10-CM | POA: Diagnosis not present

## 2021-02-20 ENCOUNTER — Ambulatory Visit (INDEPENDENT_AMBULATORY_CARE_PROVIDER_SITE_OTHER): Payer: Federal, State, Local not specified - PPO | Admitting: Family Medicine

## 2021-02-20 ENCOUNTER — Encounter (INDEPENDENT_AMBULATORY_CARE_PROVIDER_SITE_OTHER): Payer: Self-pay | Admitting: Family Medicine

## 2021-02-20 ENCOUNTER — Other Ambulatory Visit: Payer: Self-pay

## 2021-02-20 VITALS — BP 138/88 | HR 66 | Temp 97.5°F | Ht 65.0 in | Wt 291.0 lb

## 2021-02-20 DIAGNOSIS — K588 Other irritable bowel syndrome: Secondary | ICD-10-CM | POA: Diagnosis not present

## 2021-02-20 DIAGNOSIS — Z1331 Encounter for screening for depression: Secondary | ICD-10-CM

## 2021-02-20 DIAGNOSIS — E669 Obesity, unspecified: Secondary | ICD-10-CM | POA: Diagnosis not present

## 2021-02-20 DIAGNOSIS — R5383 Other fatigue: Secondary | ICD-10-CM

## 2021-02-20 DIAGNOSIS — R7303 Prediabetes: Secondary | ICD-10-CM | POA: Insufficient documentation

## 2021-02-20 DIAGNOSIS — F411 Generalized anxiety disorder: Secondary | ICD-10-CM

## 2021-02-20 DIAGNOSIS — Z9189 Other specified personal risk factors, not elsewhere classified: Secondary | ICD-10-CM

## 2021-02-20 DIAGNOSIS — Z6841 Body Mass Index (BMI) 40.0 and over, adult: Secondary | ICD-10-CM

## 2021-02-20 DIAGNOSIS — R0602 Shortness of breath: Secondary | ICD-10-CM

## 2021-02-21 LAB — COMPREHENSIVE METABOLIC PANEL
ALT: 10 IU/L (ref 0–32)
AST: 14 IU/L (ref 0–40)
Albumin/Globulin Ratio: 2 (ref 1.2–2.2)
Albumin: 4.5 g/dL (ref 3.8–4.9)
Alkaline Phosphatase: 73 IU/L (ref 44–121)
BUN/Creatinine Ratio: 18 (ref 9–23)
BUN: 15 mg/dL (ref 6–24)
Bilirubin Total: 0.5 mg/dL (ref 0.0–1.2)
CO2: 23 mmol/L (ref 20–29)
Calcium: 9.3 mg/dL (ref 8.7–10.2)
Chloride: 101 mmol/L (ref 96–106)
Creatinine, Ser: 0.85 mg/dL (ref 0.57–1.00)
Globulin, Total: 2.3 g/dL (ref 1.5–4.5)
Glucose: 102 mg/dL — ABNORMAL HIGH (ref 70–99)
Potassium: 4.6 mmol/L (ref 3.5–5.2)
Sodium: 141 mmol/L (ref 134–144)
Total Protein: 6.8 g/dL (ref 6.0–8.5)
eGFR: 79 mL/min/{1.73_m2} (ref >59)

## 2021-02-21 LAB — CBC WITH DIFFERENTIAL/PLATELET
Basophils Absolute: 0.1 10*3/uL (ref 0.0–0.2)
Basos: 1 %
EOS (ABSOLUTE): 0.2 10*3/uL (ref 0.0–0.4)
Eos: 2 %
Hematocrit: 39.6 % (ref 34.0–46.6)
Hemoglobin: 13.6 g/dL (ref 11.1–15.9)
Immature Grans (Abs): 0 10*3/uL (ref 0.0–0.1)
Immature Granulocytes: 0 %
Lymphocytes Absolute: 3 10*3/uL (ref 0.7–3.1)
Lymphs: 28 %
MCH: 30.1 pg (ref 26.6–33.0)
MCHC: 34.3 g/dL (ref 31.5–35.7)
MCV: 88 fL (ref 79–97)
Monocytes Absolute: 0.6 10*3/uL (ref 0.1–0.9)
Monocytes: 6 %
Neutrophils Absolute: 6.6 10*3/uL (ref 1.4–7.0)
Neutrophils: 63 %
Platelets: 245 10*3/uL (ref 150–450)
RBC: 4.52 x10E6/uL (ref 3.77–5.28)
RDW: 12 % (ref 11.7–15.4)
WBC: 10.5 10*3/uL (ref 3.4–10.8)

## 2021-02-21 LAB — LIPID PANEL WITH LDL/HDL RATIO
Cholesterol, Total: 174 mg/dL (ref 100–199)
HDL: 59 mg/dL (ref >39)
LDL Chol Calc (NIH): 100 mg/dL — ABNORMAL HIGH (ref 0–99)
LDL/HDL Ratio: 1.7 ratio (ref 0.0–3.2)
Triglycerides: 82 mg/dL (ref 0–149)
VLDL Cholesterol Cal: 15 mg/dL (ref 5–40)

## 2021-02-21 LAB — FOLATE: Folate: 14 ng/mL (ref >3.0)

## 2021-02-21 LAB — TSH: TSH: 2.24 u[IU]/mL (ref 0.450–4.500)

## 2021-02-21 LAB — VITAMIN D 25 HYDROXY (VIT D DEFICIENCY, FRACTURES): Vit D, 25-Hydroxy: 23.3 ng/mL — ABNORMAL LOW (ref 30.0–100.0)

## 2021-02-21 LAB — VITAMIN B12: Vitamin B-12: 130 pg/mL — ABNORMAL LOW (ref 232–1245)

## 2021-02-21 LAB — T4, FREE: Free T4: 1.07 ng/dL (ref 0.82–1.77)

## 2021-02-21 LAB — HEMOGLOBIN A1C
Est. average glucose Bld gHb Est-mCnc: 131 mg/dL
Hgb A1c MFr Bld: 6.2 % — ABNORMAL HIGH (ref 4.8–5.6)

## 2021-02-21 LAB — INSULIN, RANDOM: INSULIN: 18.1 u[IU]/mL (ref 2.6–24.9)

## 2021-02-21 NOTE — Progress Notes (Signed)
Chief Complaint:   Joann Mendez (MR# [id number]) is a 61 y.o. female who presents for evaluation and treatment of obesity and related comorbidities. Current Body mass index is 48.42 kg/m. Joann Mendez has been struggling with her weight for many years and has been unsuccessful in either losing weight, maintaining weight loss, or reaching her healthy weight goal.  Joann Mendez is currently in the action stage of change and ready to dedicate time achieving and maintaining a healthier weight. Joann Mendez is interested in becoming our patient and working on intensive lifestyle modifications including (but not limited to) diet and exercise for weight loss.  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 She is married to husband, Joann Mendez and has 2 older children who are out of the house. Due to IBS, there are lots of foods pt can't eat. She craves sugar and snacks on crackers and cheese and peanut butter. Pt's worst habit is sweets. She is a friend of Joann Mendez, who is my pt.  Joann Mendez's habits were reviewed today and are as follows: she thinks her family will eat healthier with her, her desired weight loss is 91-116 lbs, she has been heavy most of her life, she started gaining weight after her 2nd child, her heaviest weight ever was now at 291 pounds, she has significant food cravings issues, she is frequently drinking liquids with calories, she frequently eats larger portions than normal, and she struggles with emotional eating.  Depression Screen Joann Mendez's Food and Mood (modified PHQ-9) score was 17.  Depression screen [name] 2/9 02/20/2021  Decreased Interest 3  Down, Depressed, Hopeless 3  PHQ - 2 Score 6  Altered sleeping 2  Tired, decreased energy 3  Change in appetite 2  Feeling bad or failure about yourself  3  Trouble concentrating 0  Moving slowly or fidgety/restless 1  Suicidal thoughts 0  PHQ-9 Score 17  Difficult doing work/chores Somewhat difficult    Subjective:   1. Other fatigue Joann Mendez admits to daytime somnolence and admits to waking up still tired. Patent has a history of symptoms of daytime fatigue and morning fatigue. Joann Mendez generally gets 6 or 7 hours of sleep per night, and states that she has generally restful sleep. Snoring is present. Apneic episodes are not present. Epworth Sleepiness Score is 4.  2. Shortness of breath on exertion Joann Mendez notes increasing shortness of breath with exercising and seems to be worsening over time with weight gain. She notes getting out of breath sooner with activity than she used to. This has gotten worse recently. Joann Mendez denies shortness of breath at rest or orthopnea.  3. GAD (generalized anxiety disorder) Positive for more anxiety, per pt, but PHQ = 17. Pt had a lot of eye surgeries the past couple if years and was stuck at home a lot. She denies current issues with emotional eating.  4. Other irritable bowel syndrome Pt cannot have pork or beef. She eats chicken, [place], and fish. She can eat cheese, crackers, peanut butter, eggs. Pt does not consider herself a picky eater.  5. Prediabetes Dx 2-3 years ago. Medication: None  6. At risk for diabetes mellitus Joann Mendez is at higher than average risk for developing diabetes due to obesity, pre-diabetes, and past medical history.  Assessment/Plan:   Orders Placed This Encounter  Procedures   Hemoglobin A1c   Folate   Comprehensive metabolic panel   CBC with Differential/Platelet   Vitamin B12   Insulin,  random   Lipid Panel With LDL/HDL Ratio   T4, free   TSH   VITAMIN D 25 Hydroxy (Vit-D Deficiency, Fractures)   EKG 12-Lead    Medications Discontinued During This Encounter  Medication Reason   VITAMIN D PO      No orders of the defined types were placed in this encounter.    1. Other fatigue Joann Mendez does feel that her weight is causing her energy to be lower than it should be. Fatigue may be related to obesity, depression or many other  causes. Labs will be ordered, and in the meanwhile, [name] will focus on self care including making healthy food choices, increasing physical activity and focusing on stress reduction. Check labs today.  - Folate - EKG 12-Lead - Vitamin B12 - VITAMIN D 25 Hydroxy (Vit-D Deficiency, Fractures)  2. Shortness of breath on exertion Joann Mendez does feel that she gets out of breath more easily that she used to when she exercises. Joann Mendez's shortness of breath appears to be obesity related and exercise induced. She has agreed to work on weight loss and gradually increase exercise to treat her exercise induced shortness of breath. Will continue to monitor closely.  3. GAD (generalized anxiety disorder) Follow closely. Continue medication per PCP and will consider CBT in the future, if needed. Check labs today.  - T4, free - TSH  4. Other irritable bowel syndrome Pt feels meal plan will work just fine for her. She has no active concerns and is not on meds.  5. Prediabetes Joann Mendez will continue to work on weight loss, exercise, and decreasing simple carbohydrates to help decrease the risk of diabetes. Check labs today.  - Hemoglobin A1c - Comprehensive metabolic panel - CBC with Differential/Platelet - Insulin, random - Lipid Panel With LDL/HDL Ratio - T4, free - TSH  6. Screening for depression Joann Mendez had a positive depression screening. Depression is commonly associated with obesity and often results in emotional eating behaviors. We will monitor this closely and work on CBT to help improve the non-hunger eating patterns. Referral to Psychology may be required if no improvement is seen as she continues in our clinic.  7. At risk for diabetes mellitus Joann Mendez was given approximately 23 minutes of diabetes education and counseling today. We discussed intensive lifestyle modifications today with an emphasis on weight loss as well as increasing exercise and decreasing simple carbohydrates in her diet. We also  reviewed medication options with an emphasis on risk versus benefit of those discussed.   Repetitive spaced learning was employed today to elicit superior memory formation and behavioral change.  8. Obesity with current BMI of 48.5 Joann Mendez is currently in the action stage of change and her goal is to continue with weight loss efforts. I recommend Joann Mendez begin the structured treatment plan as follows:  She has agreed to the Category 2 Plan.  Exercise goals:  As is    Behavioral modification strategies: no skipping meals, meal planning and cooking strategies, and planning for success.  She was informed of the importance of frequent follow-up visits to maximize her success with intensive lifestyle modifications for her multiple health conditions. She was informed we would discuss her lab results at her next visit unless there is a critical issue that needs to be addressed sooner. Joann Mendez agreed to keep her next visit at the agreed upon time to discuss these results.  Objective:   Blood pressure 138/88, pulse 66, temperature (!) 97.5 F (36.4 C), height 5\' 5"  (1.651 m), weight  291 lb (132 kg), SpO2 99 %. Body mass index is 48.42 kg/m.  EKG: Normal sinus rhythm, rate 64- Abnormal.  Indirect Calorimeter completed today shows a VO2 of 291 and a REE of 2002.  Her calculated basal metabolic rate is 6384 thus her basal metabolic rate is better than expected.  General: Cooperative, alert, well developed, in no acute distress. HEENT: Conjunctivae and lids unremarkable. Cardiovascular: Regular rhythm.  Lungs: Normal work of breathing. Neurologic: No focal deficits.   Lab Results  Component Value Date   CREATININE 0.85 02/20/2021   BUN 15 02/20/2021   NA 141 02/20/2021   K 4.6 02/20/2021   CL 101 02/20/2021   CO2 23 02/20/2021   Lab Results  Component Value Date   ALT 10 02/20/2021   AST 14 02/20/2021   ALKPHOS 73 02/20/2021   BILITOT 0.5 02/20/2021   Lab Results  Component Value Date    HGBA1C 6.2 (H) 02/20/2021   HGBA1C 5.8 (H) 06/06/2019   Lab Results  Component Value Date   INSULIN 18.1 02/20/2021   Lab Results  Component Value Date   TSH 2.240 02/20/2021   Lab Results  Component Value Date   CHOL 174 02/20/2021   HDL 59 02/20/2021   LDLCALC 100 (H) 02/20/2021   TRIG 82 02/20/2021   Lab Results  Component Value Date   WBC 10.5 02/20/2021   HGB 13.6 02/20/2021   HCT 39.6 02/20/2021   MCV 88 02/20/2021   PLT 245 02/20/2021   Attestation Statements:   Reviewed by clinician on day of visit: allergies, medications, problem list, medical history, surgical history, family history, social history, and previous encounter notes.  Coral Ceo, CMA, am acting as transcriptionist for [employer], DO.  I have reviewed the above documentation for accuracy and completeness, and I agree with the above. Marjory Sneddon, D.O.  The [place] was signed into law in 2016 which includes the topic of electronic health records.  This provides immediate access to information in MyChart.  This includes consultation notes, operative notes, office notes, lab results and pathology reports.  If you have any questions about what you read please let us know at your next visit so we can discuss your concerns and take corrective action if need be.  We are right here with you.

## 2021-02-25 ENCOUNTER — Encounter: Payer: Self-pay | Admitting: Nurse Practitioner

## 2021-02-25 ENCOUNTER — Telehealth (INDEPENDENT_AMBULATORY_CARE_PROVIDER_SITE_OTHER): Payer: Federal, State, Local not specified - PPO | Admitting: Nurse Practitioner

## 2021-02-25 DIAGNOSIS — J40 Bronchitis, not specified as acute or chronic: Secondary | ICD-10-CM | POA: Diagnosis not present

## 2021-02-25 DIAGNOSIS — R051 Acute cough: Secondary | ICD-10-CM

## 2021-02-25 MED ORDER — BENZONATATE 200 MG PO CAPS: 200.0000 mg | ORAL_CAPSULE | Freq: Three times a day (TID) | ORAL | 0 refills | Status: AC | PRN

## 2021-02-25 MED ORDER — HYDROCODONE BIT-HOMATROP MBR 5-1.5 MG/5ML PO SOLN
5.0000 mL | Freq: Every evening | ORAL | 0 refills | Status: DC | PRN
Start: 2021-02-25 — End: 2021-02-25

## 2021-02-25 MED ORDER — HYDROCODONE BIT-HOMATROP MBR 5-1.5 MG/5ML PO SOLN: 5.0000 mL | Freq: Every evening | ORAL | 0 refills | Status: AC | PRN

## 2021-02-25 MED ORDER — DOXYCYCLINE HYCLATE 100 MG PO TABS: 100.0000 mg | ORAL_TABLET | Freq: Two times a day (BID) | ORAL | 0 refills | Status: AC

## 2021-02-25 NOTE — Progress Notes (Signed)
Patient ID: Joann Mendez, female    DOB: [birth year], 61 y.o.   MRN: [id number]  Virtual visit completed through [employer], a video enabled telemedicine application. Due to national recommendations of social distancing due to COVID-19, a virtual visit is felt to be most appropriate for this patient at this time. Reviewed limitations, risks, security and privacy concerns of performing a virtual visit and the availability of in person appointments. I also reviewed that there may be a patient responsible charge related to this service. The patient agreed to proceed.   Patient location: home Provider location: [place] at [facility], office Persons participating in this virtual visit: patient, provider   If any vitals were documented, they were collected by patient at home unless specified below.    There were no vitals taken for this visit.   CC: Nasal Congestion  Subjective:   HPI: Joann Mendez is a 61 y.o. female presenting on 02/25/2021 for Nasal Congestion (X 5 to 6 days, cough, post nasal drainage, sinus congestion/head congestion. Blowing out clear/greenish mucus, and coughing up brownish phlegm. No fever. Does have a hoarse voice. Has taking Mucinnex DM, Delsym. )  Symtpoms started 02/20/2021 No covid test Pfizer vaccine x2  Her whole family has been sick. She has adult children and they were sick they did not have covid. Patient has a history of bronchitis and walking pna  Has been using mucinex, mucinex dm and delsym without great relief   Relevant past medical, surgical, family and social history reviewed and updated as indicated. Interim medical history since our last visit reviewed. Allergies and medications reviewed and updated. Outpatient Medications Prior to Visit  Medication Sig Dispense Refill   aspirin EC 81 MG tablet Take by mouth.     B Complex Vitamins (B COMPLEX PO) Take by mouth.     citalopram (CELEXA) 20 MG tablet Take 1 tablet (20 mg total) by mouth daily. 90  tablet 3   docusate sodium (COLACE) 100 MG capsule Take 100 mg by mouth daily as needed for mild constipation.     Multiple Vitamin (MULTIVITAMIN) tablet Take 1 tablet by mouth daily.     prednisoLONE acetate (PRED FORTE) 1 % ophthalmic suspension Place 1 drop into both eyes in the morning, at noon, in the evening, and at bedtime.     No facility-administered medications prior to visit.     Per HPI unless specifically indicated in ROS section below Review of Systems  Constitutional:  Positive for fatigue. Negative for chills and fever.  HENT:  Positive for congestion, postnasal drip, sinus pressure and sinus pain. Negative for ear discharge, ear pain and sore throat.   Respiratory:  Positive for cough (brownish). Negative for shortness of breath.   Cardiovascular:  Negative for chest pain.  Gastrointestinal:  Negative for abdominal pain, diarrhea, nausea and vomiting.  Musculoskeletal:  Negative for arthralgias and myalgias.  Neurological:  Negative for headaches.  Objective:  There were no vitals taken for this visit.  Wt Readings from Last 3 Encounters:  02/20/21 291 lb (132 kg)  05/25/20 288 lb (130.6 kg)  02/17/20 290 lb (131.5 kg)       Physical exam: Gen: alert, NAD, not ill appearing Pulm: speaks in complete sentences without increased work of breathing Psych: normal mood, normal thought content      Results for orders placed or performed in visit on 02/20/21  Hemoglobin A1c  Result Value Ref Range   Hgb A1c MFr Bld 6.2 (H) 4.8 -  5.6 %   Est. average glucose Bld gHb Est-mCnc 131 mg/dL  Folate  Result Value Ref Range   Folate 14.0 >3.0 ng/mL  Comprehensive metabolic panel  Result Value Ref Range   Glucose 102 (H) 70 - 99 mg/dL   BUN 15 6 - 24 mg/dL   Creatinine, Ser 0.85 0.57 - 1.00 mg/dL   eGFR 79 >59 mL/min/1.73   BUN/Creatinine Ratio 18 9 - 23   Sodium 141 134 - 144 mmol/L   Potassium 4.6 3.5 - 5.2 mmol/L   Chloride 101 96 - 106 mmol/L   CO2 23 20 - 29  mmol/L   Calcium 9.3 8.7 - 10.2 mg/dL   Total Protein 6.8 6.0 - 8.5 g/dL   Albumin 4.5 3.8 - 4.9 g/dL   Globulin, Total 2.3 1.5 - 4.5 g/dL   Albumin/Globulin Ratio 2.0 1.2 - 2.2   Bilirubin Total 0.5 0.0 - 1.2 mg/dL   Alkaline Phosphatase 73 44 - 121 IU/L   AST 14 0 - 40 IU/L   ALT 10 0 - 32 IU/L  CBC with Differential/Platelet  Result Value Ref Range   WBC 10.5 3.4 - 10.8 x10E3/uL   RBC 4.52 3.77 - 5.28 x10E6/uL   Hemoglobin 13.6 11.1 - 15.9 g/dL   Hematocrit 39.6 34.0 - 46.6 %   MCV 88 79 - 97 fL   MCH 30.1 26.6 - 33.0 pg   MCHC 34.3 31.5 - 35.7 g/dL   RDW 12.0 11.7 - 15.4 %   Platelets 245 150 - 450 x10E3/uL   Neutrophils 63 Not Estab. %   Lymphs 28 Not Estab. %   Monocytes 6 Not Estab. %   Eos 2 Not Estab. %   Basos 1 Not Estab. %   Neutrophils Absolute 6.6 1.4 - 7.0 x10E3/uL   Lymphocytes Absolute 3.0 0.7 - 3.1 x10E3/uL   Monocytes Absolute 0.6 0.1 - 0.9 x10E3/uL   EOS (ABSOLUTE) 0.2 0.0 - 0.4 x10E3/uL   Basophils Absolute 0.1 0.0 - 0.2 x10E3/uL   Immature Granulocytes 0 Not Estab. %   Immature Grans (Abs) 0.0 0.0 - 0.1 x10E3/uL  Vitamin B12  Result Value Ref Range   Vitamin B-12 130 (L) 232 - 1,245 pg/mL  Insulin, random  Result Value Ref Range   INSULIN 18.1 2.6 - 24.9 uIU/mL  Lipid Panel With LDL/HDL Ratio  Result Value Ref Range   Cholesterol, Total 174 100 - 199 mg/dL   Triglycerides 82 0 - 149 mg/dL   HDL 59 >39 mg/dL   VLDL Cholesterol Cal 15 5 - 40 mg/dL   LDL Chol Calc (NIH) 100 (H) 0 - 99 mg/dL   LDL/HDL Ratio 1.7 0.0 - 3.2 ratio  T4, free  Result Value Ref Range   Free T4 1.07 0.82 - 1.77 ng/dL  TSH  Result Value Ref Range   TSH 2.240 0.450 - 4.500 uIU/mL  VITAMIN D 25 Hydroxy (Vit-D Deficiency, Fractures)  Result Value Ref Range   Vit D, 25-Hydroxy 23.3 (L) 30.0 - 100.0 ng/mL   Assessment & Plan:   Problem List Items Addressed This Visit       Respiratory   Bronchitis - Primary    Given signs symptoms duration and patient history will  elect to treat with doxycycline 100 mg twice daily for 7 days.  Signs and symptoms reviewed as when to seek urgent or emergent health care.      Relevant Medications   doxycycline (VIBRA-TABS) 100 MG tablet     Other  Cough    Patient having cough that is worse at night.  States she has had several retina surgeries and concerned with coughing it may affect her eyes.  We will send in some Hycodan for bedtime use Tessalon Perles through the day.      Relevant Medications   benzonatate (TESSALON) 200 MG capsule   HYDROcodone bit-homatropine (HYCODAN) 5-1.5 MG/5ML syrup     Meds ordered this encounter  Medications   doxycycline (VIBRA-TABS) 100 MG tablet    Sig: Take 1 tablet (100 mg total) by mouth 2 (two) times daily for 7 days.    Dispense:  14 tablet    Refill:  0    Order Specific Question:   Supervising Provider    Answer:   Glori Bickers MARNE A [[id number]]   benzonatate (TESSALON) 200 MG capsule    Sig: Take 1 capsule (200 mg total) by mouth 3 (three) times daily as needed for up to 7 days for cough.    Dispense:  21 capsule    Refill:  0    Order Specific Question:   Supervising Provider    Answer:   Loura Pardon A [[id number]]   DISCONTD: HYDROcodone bit-homatropine (HYCODAN) 5-1.5 MG/5ML syrup    Sig: Take 5 mLs by mouth at bedtime as needed for up to 10 days for cough.    Dispense:  50 mL    Refill:  0    Order Specific Question:   Supervising Provider    Answer:   Glori Bickers, MARNE A [[id number]]   HYDROcodone bit-homatropine (HYCODAN) 5-1.5 MG/5ML syrup    Sig: Take 5 mLs by mouth at bedtime as needed for up to 10 days for cough.    Dispense:  50 mL    Refill:  0    Order Specific Question:   Supervising Provider    Answer:   TOWER, MARNE A [[id number]]   No orders of the defined types were placed in this encounter.   I discussed the assessment and treatment plan with the patient. The patient was provided an opportunity to ask questions and all were answered. The patient agreed with the plan  and demonstrated an understanding of the instructions. The patient was advised to call back or seek an in-person evaluation if the symptoms worsen or if the condition fails to improve as anticipated.  Follow up plan: No follow-ups on file.  Romilda Garret, NP

## 2021-02-25 NOTE — Assessment & Plan Note (Signed)
Given signs symptoms duration and patient history will elect to treat with doxycycline 100 mg twice daily for 7 days.  Signs and symptoms reviewed as when to seek urgent or emergent health care.

## 2021-02-25 NOTE — Assessment & Plan Note (Signed)
Patient having cough that is worse at night.  States she has had several retina surgeries and concerned with coughing it may affect her eyes.  We will send in some Hycodan for bedtime use Tessalon Perles through the day.

## 2021-03-06 ENCOUNTER — Encounter (INDEPENDENT_AMBULATORY_CARE_PROVIDER_SITE_OTHER): Payer: Self-pay | Admitting: Family Medicine

## 2021-03-06 ENCOUNTER — Other Ambulatory Visit: Payer: Self-pay

## 2021-03-06 ENCOUNTER — Ambulatory Visit (INDEPENDENT_AMBULATORY_CARE_PROVIDER_SITE_OTHER): Payer: Federal, State, Local not specified - PPO | Admitting: Family Medicine

## 2021-03-06 VITALS — BP 137/82 | HR 68 | Temp 98.1°F | Ht 65.0 in | Wt 285.0 lb

## 2021-03-06 DIAGNOSIS — E559 Vitamin D deficiency, unspecified: Secondary | ICD-10-CM | POA: Diagnosis not present

## 2021-03-06 DIAGNOSIS — E538 Deficiency of other specified B group vitamins: Secondary | ICD-10-CM

## 2021-03-06 DIAGNOSIS — K588 Other irritable bowel syndrome: Secondary | ICD-10-CM

## 2021-03-06 DIAGNOSIS — E669 Obesity, unspecified: Secondary | ICD-10-CM

## 2021-03-06 DIAGNOSIS — R7303 Prediabetes: Secondary | ICD-10-CM | POA: Diagnosis not present

## 2021-03-06 DIAGNOSIS — Z9189 Other specified personal risk factors, not elsewhere classified: Secondary | ICD-10-CM

## 2021-03-06 DIAGNOSIS — Z6841 Body Mass Index (BMI) 40.0 and over, adult: Secondary | ICD-10-CM

## 2021-03-06 DIAGNOSIS — E7849 Other hyperlipidemia: Secondary | ICD-10-CM

## 2021-03-06 MED ORDER — VITAMIN B-12 1000 MCG PO TABS
1000.0000 ug | ORAL_TABLET | Freq: Every day | ORAL | Status: DC
Start: 2021-03-06 — End: 2022-09-22

## 2021-03-06 MED ORDER — VITAMIN D (ERGOCALCIFEROL) 1.25 MG (50000 UNIT) PO CAPS
50000.0000 [IU] | ORAL_CAPSULE | ORAL | 0 refills | Status: DC
Start: 2021-03-06 — End: 2021-04-15

## 2021-03-12 NOTE — Progress Notes (Signed)
Chief Complaint:   OBESITY Joann Mendez is here to discuss her progress with her obesity treatment plan along with follow-up of her obesity related diagnoses. Joann Mendez is on the Category 2 Plan and states she is following her eating plan approximately 90+% of the time. Joann Mendez states she is doing 0 minutes 0 times per week.  Today's visit was #: 2 Starting weight: 291 lbs Starting date: 02/20/2021 Today's weight: 285 lbs Today's date: 03/06/2021 Total lbs lost to date: 6 Total lbs lost since last in-office visit: 6  Interim History: Joann Mendez is weighing her protein as well. She snacks on nuts 100 calorie packs, and crackers and cheese. She only had one small Coke in the past 2 weeks. She is practicing mindful eating and she is doing great.  Subjective:   1. B12 deficiency due to diet Joann Mendez has a new diagnosis of B12 deficiency. She notes fatigue, only lightly improved with her meal plan. I discussed labs with the patient today.   2. Pre-diabetes Joann Mendez's A1c has increased from 5.8 to 6.2 now. I discussed labs with the patient today.  3. Other irritable bowel syndrome Joann Mendez's symptoms are stable and she has no issues currently. I discussed labs with the patient today.  4. Vitamin D deficiency Joann Mendez has a new diagnosis of Vit D deficiency. She is not currently taking vitamin D supplement. She denies nausea, vomiting or muscle weakness. I discussed labs with the patient today.  5. Other hyperlipidemia Joann Mendez has a new diagnosis of hyperlipidemia. Her LDL is slightly elevated. She is not on medications. I discussed labs with the patient today.   The 10-year ASCVD risk score (Arnett DK, et al., 2019) is: 2.6%   Values used to calculate the score:     Age: 61 years     Sex: Female     Is Non-Hispanic African American: No     Diabetic: No     Tobacco smoker: No     Systolic Blood Pressure: 938 mmHg     Is BP treated: No     HDL Cholesterol: 59 mg/dL     Total Cholesterol: 174 mg/dL  6. At risk for  diabetes mellitus Joann Mendez is at higher than average risk for developing diabetes due to her obesity.  Assessment/Plan:   1. B12 deficiency due to diet The diagnosis was reviewed with the patient. Counseling provided today, see below. We will continue to monitor. Kareem agreed to start B12 1,000 mcg daily with no refills. We will recheck labs in 3 months. Orders and follow up as documented in patient record.  Counseling The body needs vitamin B12: to make red blood cells; to make DNA; and to help the nerves work properly so they can carry messages from the brain to the body.  The main causes of vitamin B12 deficiency include dietary deficiency, digestive diseases, pernicious anemia, and having a surgery in which part of the stomach or small intestine is removed.  Certain medicines can make it harder for the body to absorb vitamin B12. These medicines include: heartburn medications; some antibiotics; some medications used to treat diabetes, gout, and high cholesterol.  In some cases, there are no symptoms of this condition. If the condition leads to anemia or nerve damage, various symptoms can occur, such as weakness or fatigue, shortness of breath, and numbness or tingling in your hands and feet.   Treatment:  May include taking vitamin B12 supplements.  Avoid alcohol.  Eat lots of healthy foods that contain vitamin  B12: Beef, pork, chicken, [place], and organ meats, such as liver.  Seafood: This includes clams, rainbow trout, salmon, tuna, and haddock. Eggs.  Cereal and dairy products that are fortified: This means that vitamin B12 has been added to the food.   - vitamin B-12 (CYANOCOBALAMIN) 1000 MCG tablet; Take 1 tablet (1,000 mcg total) by mouth daily.  2. Pre-diabetes We will consider medications in the future. Handouts were given after counseling was done. We will recheck labs in 3 months. Farran will continue her prudent nutritional plan and weight loss to help decrease the risk of diabetes.    3. Other irritable bowel syndrome Joann Mendez's labs and symptoms are stable, will continue to monitor  4. Vitamin D deficiency Joann Mendez agreed to start prescription Vitamin D 50,000 IU weekly with no refills.  - Discussed importance of vitamin D to their health and well-being.  - possible symptoms of low Vitamin D can be low energy, depressed mood, muscle aches, joint aches, osteoporosis etc. - low Vitamin D levels may be linked to an increased risk of cardiovascular events and even increased risk of cancers- such as colon and breast.  - I recommend pt take a 50,000 IU weekly prescription vit D - see script below   - Informed patient this may be a lifelong thing, and she was encouraged to continue to take the medicine until told otherwise.   - we will need to monitor levels regularly (every 3-4 mo on average) to keep levels within normal limits.  - weight loss will likely improve availability of vitamin D, thus encouraged Joann Mendez to continue with meal plan and their weight loss efforts to further improve this condition - pt's questions and concerns regarding this condition addressed.  - Vitamin D, Ergocalciferol, (DRISDOL) 1.25 MG (50000 UNIT) CAPS capsule; Take 1 capsule (50,000 Units total) by mouth every 7 (seven) days.  Dispense: 4 capsule; Refill: 0  5. Other hyperlipidemia Cardiovascular risk and specific lipid/LDL goals reviewed. Joann Mendez will continue to decrease saturated and trans fats. She will continue her prudent nutritional plan and eventually exercise. Orders and follow up as documented in patient record.   Counseling Intensive lifestyle modifications are the first line treatment for this issue. Dietary changes: Increase soluble fiber. Decrease simple carbohydrates. Exercise changes: Moderate to vigorous-intensity aerobic activity 150 minutes per week if tolerated. Lipid-lowering medications: see documented in medical record.  6. At risk for diabetes mellitus - Joann Mendez was given diabetes  prevention education and counseling today of more than 23 minutes.  - Counseled patient on pathophysiology of disease and meaning/ implication of lab results.  - Reviewed how certain foods can either stimulate or inhibit insulin release, and subsequently affect hunger pathways  - Importance of following a healthy meal plan with limiting amounts of simple carbohydrates discussed with patient - Effects of regular aerobic exercise on blood sugar regulation reviewed and encouraged an eventual goal of 30 min 5d/week or more as a minimum.  - Briefly discussed treatment options, which always include dietary and lifestyle modification as first line.   - Handouts provided at patient's desire and/or told to go online to the American Diabetes Association website for further information.   7. Obesity with current BMI of 47.5 Joann Mendez is currently in the action stage of change. As such, her goal is to continue with weight loss efforts. She has agreed to the Category 2 Plan.   Exercise goals: Start walking for 15 minutes 2 days per week.  Behavioral modification strategies: increasing lean protein intake,  decreasing simple carbohydrates, and planning for success.  Joann Mendez has agreed to follow-up with our clinic in 2 weeks. She was informed of the importance of frequent follow-up visits to maximize her success with intensive lifestyle modifications for her multiple health conditions.     Objective:   Blood pressure 137/82, pulse 68, temperature 98.1 F (36.7 C), height 5\' 5"  (1.651 m), weight 285 lb (129.3 kg), SpO2 100 %. Body mass index is 47.43 kg/m.  General: Cooperative, alert, well developed, in no acute distress. HEENT: Conjunctivae and lids unremarkable. Cardiovascular: Regular rhythm.  Lungs: Normal work of breathing. Neurologic: No focal deficits.   Lab Results  Component Value Date   CREATININE 0.85 02/20/2021   BUN 15 02/20/2021   NA 141 02/20/2021   K 4.6 02/20/2021   CL 101 02/20/2021    CO2 23 02/20/2021   Lab Results  Component Value Date   ALT 10 02/20/2021   AST 14 02/20/2021   ALKPHOS 73 02/20/2021   BILITOT 0.5 02/20/2021   Lab Results  Component Value Date   HGBA1C 6.2 (H) 02/20/2021   HGBA1C 5.8 (H) 06/06/2019   Lab Results  Component Value Date   INSULIN 18.1 02/20/2021   Lab Results  Component Value Date   TSH 2.240 02/20/2021   Lab Results  Component Value Date   CHOL 174 02/20/2021   HDL 59 02/20/2021   LDLCALC 100 (H) 02/20/2021   TRIG 82 02/20/2021   Lab Results  Component Value Date   VD25OH 23.3 (L) 02/20/2021   Lab Results  Component Value Date   WBC 10.5 02/20/2021   HGB 13.6 02/20/2021   HCT 39.6 02/20/2021   MCV 88 02/20/2021   PLT 245 02/20/2021   No results found for: IRON, TIBC, FERRITIN  Attestation Statements:   Reviewed by clinician on day of visit: allergies, medications, problem list, medical history, surgical history, family history, social history, and previous encounter notes.  Wilhemena Durie, am acting as transcriptionist for [employer], DO.  I have reviewed the above documentation for accuracy and completeness, and I agree with the above. Marjory Sneddon, D.O.  The [place] was signed into law in 2016 which includes the topic of electronic health records.  This provides immediate access to information in MyChart.  This includes consultation notes, operative notes, office notes, lab results and pathology reports.  If you have any questions about what you read please let us know at your next visit so we can discuss your concerns and take corrective action if need be.  We are right here with you.

## 2021-04-01 ENCOUNTER — Ambulatory Visit (INDEPENDENT_AMBULATORY_CARE_PROVIDER_SITE_OTHER): Payer: Federal, State, Local not specified - PPO | Admitting: Adult Health

## 2021-04-02 ENCOUNTER — Ambulatory Visit (INDEPENDENT_AMBULATORY_CARE_PROVIDER_SITE_OTHER): Payer: Federal, State, Local not specified - PPO | Admitting: Adult Health

## 2021-04-02 DIAGNOSIS — H35352 Cystoid macular degeneration, left eye: Secondary | ICD-10-CM | POA: Diagnosis not present

## 2021-04-15 ENCOUNTER — Other Ambulatory Visit: Payer: Self-pay

## 2021-04-15 ENCOUNTER — Encounter (INDEPENDENT_AMBULATORY_CARE_PROVIDER_SITE_OTHER): Payer: Self-pay | Admitting: Family Medicine

## 2021-04-15 ENCOUNTER — Ambulatory Visit (INDEPENDENT_AMBULATORY_CARE_PROVIDER_SITE_OTHER): Payer: Federal, State, Local not specified - PPO | Admitting: Family Medicine

## 2021-04-15 VITALS — BP 132/87 | HR 66 | Temp 97.4°F | Ht 65.0 in | Wt 276.0 lb

## 2021-04-15 DIAGNOSIS — E559 Vitamin D deficiency, unspecified: Secondary | ICD-10-CM | POA: Diagnosis not present

## 2021-04-15 DIAGNOSIS — Z9189 Other specified personal risk factors, not elsewhere classified: Secondary | ICD-10-CM

## 2021-04-15 DIAGNOSIS — Z6841 Body Mass Index (BMI) 40.0 and over, adult: Secondary | ICD-10-CM

## 2021-04-15 DIAGNOSIS — E669 Obesity, unspecified: Secondary | ICD-10-CM | POA: Diagnosis not present

## 2021-04-15 DIAGNOSIS — E538 Deficiency of other specified B group vitamins: Secondary | ICD-10-CM

## 2021-04-15 MED ORDER — VITAMIN D (ERGOCALCIFEROL) 1.25 MG (50000 UNIT) PO CAPS: 50000.0000 [IU] | ORAL_CAPSULE | ORAL | 0 refills | Status: DC

## 2021-04-18 NOTE — Progress Notes (Signed)
? ? ? ?Chief Complaint:  ? ?OBESITY ?Joann Mendez is here to discuss her progress with her obesity treatment plan along with follow-up of her obesity related diagnoses. Joann Mendez is on the Category 2 Plan and states she is following her eating plan approximately 90% of the time. 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 ? ?Today's visit was #: 3 ?Starting weight: 291 lbs ?Starting date: 02/20/2021 ?Today's weight: 276 lbs ?Today's date: 04/15/2021 ?Total lbs lost to date: 15 lbs ?Total lbs lost since last in-office visit: 9 lbs ? ?Interim History: Joann Mendez is learning to make better decisions.  She practiced mindfulness that we discussed at last office visit.  She met her goal of walking for 15 minutes 2 days per week. ? ?Subjective:  ? ?1. Vitamin D deficiency ?Started vitamin D at last office visit.  Tolerating well.  No side effects.  Compliance good. ? ?2. B12 deficiency ?She started OTC B12 1000 mcg daily after last office visit.  Tolerating well.  No side effects or concerns. ? ?3. At risk for constipation ?Joann Mendez is at increased risk for constipation due to higher protein in diet. ? ?Assessment/Plan:  ?No orders of the defined types were placed in this encounter. ? ? ?Medications Discontinued During This Encounter  ?Medication Reason  ? B Complex Vitamins (B COMPLEX PO)   ? Multiple Vitamin (MULTIVITAMIN) tablet   ? Vitamin D, Ergocalciferol, (DRISDOL) 1.25 MG (50000 UNIT) CAPS capsule Reorder  ?  ? ?Meds ordered this encounter  ?Medications  ? Vitamin D, Ergocalciferol, (DRISDOL) 1.25 MG (50000 UNIT) CAPS capsule  ?  Sig: Take 1 capsule (50,000 Units total) by mouth every 7 (seven) days.  ?  Dispense:  4 capsule  ?  Refill:  0  ?  30 d supply;  ** OV for RF **   Do not send RF request  ?  ? ?1. Vitamin D deficiency ?Low Vitamin D level contributes to fatigue and are associated with obesity, breast, and colon cancer. She agrees to continue to take prescription Vitamin D _0 ,000 IU  every week and will follow-up for routine testing of Vitamin D, at least 2-3 times per year to avoid over-replacement. ? ?- Refill Vitamin D, Ergocalciferol, (DRISDOL) 1.25 MG (50000 UNIT) CAPS capsule; Take 1 capsule (50,000 Units total) by mouth every 7 (seven) days.  Dispense: 4 capsule; Refill: 0 ? ?2. B12 deficiency ?The diagnosis was reviewed with the patient. Counseling provided today, see below. We will continue to monitor. Orders and follow up as documented in patient record. ? ?Counseling ?The body needs vitamin B12: to make red blood cells; to make DNA; and to help the nerves work properly so they can carry messages from the brain to the body.  ?The main causes of vitamin B12 deficiency include dietary deficiency, digestive diseases, pernicious anemia, and having a surgery in which part of the stomach or small intestine is removed.  ?Certain medicines can make it harder for the body to absorb vitamin B12. These medicines include: heartburn medications; some antibiotics; some medications used to treat diabetes, gout, and high cholesterol.  ?In some cases, there are no symptoms of this condition. If the condition leads to anemia or nerve damage, various symptoms can occur, such as weakness or fatigue, shortness of breath, and numbness or tingling in your hands and feet.   ?Treatment:  ?May include taking vitamin B12 supplements.  ?Avoid alcohol.  ?Eat lots of healthy foods  that contain vitamin B12: ?Beef, pork, chicken, [place], and organ meats, such as liver.  ?Seafood: This includes clams, rainbow trout, salmon, tuna, and haddock. Eggs.  ?Cereal and dairy products that are fortified: This means that vitamin B12 has been added to the food.  ? ?3. At risk for constipation ?Joann Mendez was given approximately 9 minutes of counseling today regarding prevention of constipation. She was encouraged to increase water and fiber intake.  ? ?4. Obesity with current BMI of 46.0 ? ?Joann Mendez is currently in the action stage of  change. As such, her goal is to continue with weight loss efforts. She has agreed to the Category 2 Plan with breakfast options. ? ?Snack options discussed with patient.  ? ?Exercise goals:  Walk for 15 minutes 3 days per week. ? ?Behavioral modification strategies: better snacking choices and planning for success. ? ?Joann Mendez has agreed to follow-up with our clinic in 2 weeks with Mina Marble, NP. She was informed of the importance of frequent follow-up visits to maximize her success with intensive lifestyle modifications for her multiple health conditions.  ? ?Objective:  ? ?Blood pressure 132/87, pulse 66, temperature (!) 97.4 ?F (36.3 ?C), height _0  (1.651 m), weight 276 lb (125.2 kg), SpO2 97 %. ?Body mass index is 45.93 kg/m?. ? ?General: Cooperative, alert, well developed, in no acute distress. ?HEENT: Conjunctivae and lids unremarkable. ?Cardiovascular: Regular rhythm.  ?Lungs: Normal work of breathing. ?Neurologic: No focal deficits.  ? ?Lab Results  ?Component Value Date  ? CREATININE 0.85 02/20/2021  ? BUN 15 02/20/2021  ? NA 141 02/20/2021  ? K 4.6 02/20/2021  ? CL 101 02/20/2021  ? CO2 23 02/20/2021  ? ?Lab Results  ?Component Value Date  ? ALT 10 02/20/2021  ? AST 14 02/20/2021  ? ALKPHOS 73 02/20/2021  ? BILITOT 0.5 02/20/2021  ? ?Lab Results  ?Component Value Date  ? HGBA1C 6.2 (H) 02/20/2021  ? HGBA1C 5.8 (H) 06/06/2019  ? ?Lab Results  ?Component Value Date  ? INSULIN 18.1 02/20/2021  ? ?Lab Results  ?Component Value Date  ? TSH 2.240 02/20/2021  ? ?Lab Results  ?Component Value Date  ? CHOL 174 02/20/2021  ? HDL 59 02/20/2021  ? LDLCALC 100 (H) 02/20/2021  ? TRIG 82 02/20/2021  ? ?Lab Results  ?Component Value Date  ? VD25OH 23.3 (L) 02/20/2021  ? ?Lab Results  ?Component Value Date  ? WBC 10.5 02/20/2021  ? HGB 13.6 02/20/2021  ? HCT 39.6 02/20/2021  ? MCV 88 02/20/2021  ? PLT 245 02/20/2021  ? ?Attestation Statements:  ? ?Reviewed by clinician on day of visit: allergies, medications, problem  list, medical history, surgical history, family history, social history, and previous encounter notes. ? ?I, Water quality scientist, CMA, am acting as transcriptionist for [employer], DO. ? ?I have reviewed the above documentation for accuracy and completeness, and I agree with the above. Marjory Sneddon, D.O. ? ?The [place] was signed into law in 2016 which includes the topic of electronic health records.  This provides immediate access to information in MyChart.  This includes consultation notes, operative notes, office notes, lab results and pathology reports.  If you have any questions about what you read please let us know at your next visit so we can discuss your concerns and take corrective action if need be.  We are right here with you. ? ?

## 2021-04-29 ENCOUNTER — Ambulatory Visit (INDEPENDENT_AMBULATORY_CARE_PROVIDER_SITE_OTHER): Payer: Federal, State, Local not specified - PPO | Admitting: Adult Health

## 2021-04-29 ENCOUNTER — Encounter (INDEPENDENT_AMBULATORY_CARE_PROVIDER_SITE_OTHER): Payer: Self-pay | Admitting: Adult Health

## 2021-04-29 VITALS — BP 136/81 | HR 59 | Temp 97.9°F | Ht 65.0 in | Wt 276.0 lb

## 2021-04-29 DIAGNOSIS — Z6841 Body Mass Index (BMI) 40.0 and over, adult: Secondary | ICD-10-CM

## 2021-04-29 DIAGNOSIS — K580 Irritable bowel syndrome with diarrhea: Secondary | ICD-10-CM

## 2021-04-29 DIAGNOSIS — R7303 Prediabetes: Secondary | ICD-10-CM

## 2021-04-29 DIAGNOSIS — E538 Deficiency of other specified B group vitamins: Secondary | ICD-10-CM | POA: Diagnosis not present

## 2021-04-29 DIAGNOSIS — E669 Obesity, unspecified: Secondary | ICD-10-CM | POA: Diagnosis not present

## 2021-05-01 NOTE — Progress Notes (Signed)
? ? ? ?Chief Complaint:  ? ?OBESITY ?Joann Mendez is here to discuss her progress with her obesity treatment plan along with follow-up of her obesity related diagnoses. Joann Mendez is on the Category 2 Plan with breakfast options and states she is following her eating plan approximately 90% of the time. Joann Mendez states she is walking for 15 minutes 3 times per week. ? ?Today's visit was #: 4 ?Starting weight: 291 lbs ?Starting date: 02/20/2021 ?Today's weight: 276 lbs ?Today's date: 04/29/2021 ?Total lbs lost to date: 15 lbs ?Total lbs lost since last in-office visit: 0 ? ?Interim History:  ?Joann Mendez says she is not in the mood for fruits or vegetables.  ?Breakfast:  2 eggs, 2 slices of toast. ?Lunch:  4 oz meat protein sandwich or Lean Cuisine. ?Dinner: Various forms of protein. ?Snack:  PB crackers.   ? 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 ? ?Subjective:  ? ?1. Pre-diabetes ?She is not on any BG lowering medications. ? ?2. B12 deficiency ?She has started oral cyanocobalamin 1000 mcg daily. ?She reports an increase in energy level after 2 weeks of cyanocobalamin. ? ?3. Irritable bowel syndrome with diarrhea ?Citalopram 20 mg daily - she reports stable mood until "a curve ball" aka "impactful life event"" increased stress levels.   ?When stress worsens she endorses acute symptoms N/V. ? ?Assessment/Plan:  ? ?1. Pre-diabetes ?Monitor labs. ? ?2. B12 deficiency ?Continue oral cyanocobalamin. ? ?3. Irritable bowel syndrome with diarrhea ?Continue regular walking, continue daily citalopram. ? ?4. Obesity with current BMI of 46.0 ? ?Joann Mendez is currently in the action stage of change. As such, her goal is to continue with weight loss efforts. She has agreed to the Category 2 Plan with breakfast options..  ? ?Change dinner and lunch timing in office visit. ? ?Handout:  100/200 Snack Sheets. ? ?Exercise goals:  As is. ? ?Behavioral modification strategies: increasing lean protein intake,  decreasing simple carbohydrates, meal planning and cooking strategies, keeping healthy foods in the home, travel eating strategies, and planning for success. ? ?Joann Mendez has agreed to follow-up with our clinic in 2 weeks. She was informed of the importance of frequent follow-up visits to maximize her success with intensive lifestyle modifications for her multiple health conditions.  ? ?Objective:  ? ?Blood pressure 136/81, pulse (!) 59, temperature 97.9 ?F (36.6 ?C), height '5\' 5"'$  (1.651 m), weight 276 lb (125.2 kg), SpO2 98 %. ?Body mass index is 45.93 kg/m?. ? ?General: Cooperative, alert, well developed, in no acute distress. ?HEENT: Conjunctivae and lids unremarkable. ?Cardiovascular: Regular rhythm.  ?Lungs: Normal work of breathing. ?Neurologic: No focal deficits.  ? ?Lab Results  ?Component Value Date  ? CREATININE 0.85 02/20/2021  ? BUN 15 02/20/2021  ? NA 141 02/20/2021  ? K 4.6 02/20/2021  ? CL 101 02/20/2021  ? CO2 23 02/20/2021  ? ?Lab Results  ?Component Value Date  ? ALT 10 02/20/2021  ? AST 14 02/20/2021  ? ALKPHOS 73 02/20/2021  ? BILITOT 0.5 02/20/2021  ? ?Lab Results  ?Component Value Date  ? HGBA1C 6.2 (H) 02/20/2021  ? HGBA1C 5.8 (H) 06/06/2019  ? ?Lab Results  ?Component Value Date  ? INSULIN 18.1 02/20/2021  ? ?Lab Results  ?Component Value Date  ? TSH 2.240 02/20/2021  ? ?Lab Results  ?Component Value Date  ? CHOL 174 02/20/2021  ? HDL 59 02/20/2021  ? LDLCALC 100 (H) 02/20/2021  ? TRIG 82 02/20/2021  ? ?Lab Results  ?Component Value  Date  ? VD25OH 23.3 (L) 02/20/2021  ? ?Lab Results  ?Component Value Date  ? WBC 10.5 02/20/2021  ? HGB 13.6 02/20/2021  ? HCT 39.6 02/20/2021  ? MCV 88 02/20/2021  ? PLT 245 02/20/2021  ? ?Attestation Statements:  ? ?Reviewed by clinician on day of visit: allergies, medications, problem list, medical history, surgical history, family history, social history, and previous encounter notes. ? ?Time spent on visit including pre-visit chart review and post-visit care and  charting was 28 minutes.  ? ?I, Water quality scientist, CMA, am acting as Location manager for Mina Marble, NP. ? ?I have reviewed the above documentation for accuracy and completeness, and I agree with the above. -  Susan Bleich d. Momina Hunton, NP-C ?

## 2021-05-09 ENCOUNTER — Other Ambulatory Visit (INDEPENDENT_AMBULATORY_CARE_PROVIDER_SITE_OTHER): Payer: Self-pay | Admitting: Family Medicine

## 2021-05-09 DIAGNOSIS — E559 Vitamin D deficiency, unspecified: Secondary | ICD-10-CM

## 2021-05-13 ENCOUNTER — Ambulatory Visit (INDEPENDENT_AMBULATORY_CARE_PROVIDER_SITE_OTHER): Payer: Federal, State, Local not specified - PPO | Admitting: Family Medicine

## 2021-05-13 ENCOUNTER — Encounter (INDEPENDENT_AMBULATORY_CARE_PROVIDER_SITE_OTHER): Payer: Self-pay | Admitting: Family Medicine

## 2021-05-13 VITALS — BP 134/79 | HR 57 | Temp 97.7°F | Ht 65.0 in | Wt 273.0 lb

## 2021-05-13 DIAGNOSIS — Z6841 Body Mass Index (BMI) 40.0 and over, adult: Secondary | ICD-10-CM

## 2021-05-13 DIAGNOSIS — E538 Deficiency of other specified B group vitamins: Secondary | ICD-10-CM

## 2021-05-13 DIAGNOSIS — F39 Unspecified mood [affective] disorder: Secondary | ICD-10-CM

## 2021-05-13 DIAGNOSIS — E559 Vitamin D deficiency, unspecified: Secondary | ICD-10-CM | POA: Diagnosis not present

## 2021-05-13 DIAGNOSIS — Z9189 Other specified personal risk factors, not elsewhere classified: Secondary | ICD-10-CM

## 2021-05-13 DIAGNOSIS — E669 Obesity, unspecified: Secondary | ICD-10-CM | POA: Diagnosis not present

## 2021-05-13 MED ORDER — VITAMIN D (ERGOCALCIFEROL) 1.25 MG (50000 UNIT) PO CAPS: 50000.0000 [IU] | ORAL_CAPSULE | ORAL | 0 refills | Status: DC

## 2021-05-23 NOTE — Progress Notes (Signed)
? ? ? ?Chief Complaint:  ? ?OBESITY ?Joann Mendez is here to discuss her progress with her obesity treatment plan along with follow-up of her obesity related diagnoses. Joann Mendez is on the Category 2 Plan with breakfast options and states she is following her eating plan approximately 85-90% of the time. Joann Mendez states she is walking for 15 minutes 3 times per week. ? ?Today's visit was #: 5 ?Starting weight: 291 lbs ?Starting date: 02/20/2021 ?Today's weight: 273 lbs ?Today's date: 05/13/2021 ?Total lbs lost to date: 48 ?Total lbs lost since last in-office visit: 3 ? ?Interim History: Joann Mendez is still struggling with snacks/ideas for snacks. She was able to walk around more [place] the [place] and proud of herself she got around as well as she did. She even packed her lunch.  ? ?Subjective:  ? ?1. Vitamin D deficiency ?Joann Mendez is currently taking prescription vitamin D 50,000 IU each week. She denies nausea, vomiting or muscle weakness. ? ?2. Vitamin B12 deficiency ?With the meal plan, Joann Mendez's B12 is slowly improving. She is taking 1,000 B12 daily. Her energy levels and she is able to walk further and be much more active.  ? ?3. Mood disorder ([place]) ?Joann Mendez is on Celexa and her mood is stable, and she has no concerns today.  ? ?4. At risk for diabetes mellitus ?Joann Mendez is at higher than average risk for developing diabetes due to her obesity. ? ?Assessment/Plan:  ?No orders of the defined types were placed in this encounter. ? ? ?Medications Discontinued During This Encounter  ?Medication Reason  ? Vitamin D, Ergocalciferol, (DRISDOL) 1.25 MG (50000 UNIT) CAPS capsule Reorder  ?  ? ?Meds ordered this encounter  ?Medications  ? Vitamin D, Ergocalciferol, (DRISDOL) 1.25 MG (50000 UNIT) CAPS capsule  ?  Sig: Take 1 capsule (50,000 Units total) by mouth every 7 (seven) days.  ?  Dispense:  4 capsule  ?  Refill:  0  ?  30 d supply;  ** OV for RF **   Do not send RF request  ?  ? ?1. Vitamin D deficiency ?We will refill prescription Vitamin D for 1 month.  Joann Mendez will follow-up for routine testing of Vitamin D, at least 2-3 times per year to avoid over-replacement. ? ?- Vitamin D, Ergocalciferol, (DRISDOL) 1.25 MG (50000 UNIT) CAPS capsule; Take 1 capsule (50,000 Units total) by mouth every 7 (seven) days.  Dispense: 4 capsule; Refill: 0 ? ?2. Vitamin B12 deficiency ?Joann Mendez will continue her prudent nutritional plan and B12 supplement, and we will recheck labs in mid-May.  ? ?3. Mood disorder ([place]) ?Joann Mendez will continue her medications as written, and increase activity.  ? ?4. At risk for diabetes mellitus ?Joann Mendez was given approximately 9 minutes of diabetic education and counseling today. We discussed intensive lifestyle modifications today with an emphasis on weight loss as well as increasing exercise and decreasing simple carbohydrates in her diet. We also reviewed medication options with an emphasis on risk versus benefits of those discussed. ? ?Repetitive spaced learning was employed today to elicit superior memory formation and behavioral change. ? ?5. Obesity with current BMI of 45.4 ?Joann Mendez is currently in the action stage of change. As such, her goal is to continue with weight loss efforts. She has agreed to the Category 2 Plan with breakfast options.  ? ?Snack handouts were given to the patient. ? ?Exercise goals: Increase activity to 30 minutes 3-5 days per week.  ? ?Behavioral modification strategies: better snacking choices and planning for success. ? ?Joann Mendez  has agreed to follow-up with our clinic in 3 to 4 weeks. She was informed of the importance of frequent follow-up visits to maximize her success with intensive lifestyle modifications for her multiple health conditions.  ? ?Objective:  ? ?Blood pressure 134/79, pulse (!) 57, temperature 97.7 ?F (36.5 ?C), height '5\' 5"'$  (1.651 m), weight 273 lb (123.8 kg), SpO2 100 %. ?Body mass index is 45.43 kg/m?. ? ?General: Cooperative, alert, well developed, in no acute distress. ?HEENT: Conjunctivae and lids  unremarkable. ?Cardiovascular: Regular rhythm.  ?Lungs: Normal work of breathing. ?Neurologic: No focal deficits.  ? ?Lab Results  ?Component Value Date  ? CREATININE 0.85 02/20/2021  ? BUN 15 02/20/2021  ? NA 141 02/20/2021  ? K 4.6 02/20/2021  ? CL 101 02/20/2021  ? CO2 23 02/20/2021  ? ?Lab Results  ?Component Value Date  ? ALT 10 02/20/2021  ? AST 14 02/20/2021  ? ALKPHOS 73 02/20/2021  ? BILITOT 0.5 02/20/2021  ? ?Lab Results  ?Component Value Date  ? HGBA1C 6.2 (H) 02/20/2021  ? HGBA1C 5.8 (H) 06/06/2019  ? ?Lab Results  ?Component Value Date  ? INSULIN 18.1 02/20/2021  ? ?Lab Results  ?Component Value Date  ? TSH 2.240 02/20/2021  ? ?Lab Results  ?Component Value Date  ? CHOL 174 02/20/2021  ? HDL 59 02/20/2021  ? LDLCALC 100 (H) 02/20/2021  ? TRIG 82 02/20/2021  ? ?Lab Results  ?Component Value Date  ? VD25OH 23.3 (L) 02/20/2021  ? ?Lab Results  ?Component Value Date  ? WBC 10.5 02/20/2021  ? HGB 13.6 02/20/2021  ? HCT 39.6 02/20/2021  ? MCV 88 02/20/2021  ? PLT 245 02/20/2021  ? ?No results found for: IRON, TIBC, FERRITIN ? ?Attestation Statements:  ? ?Reviewed by clinician on day of visit: allergies, medications, problem list, medical history, surgical history, family history, social history, and previous encounter notes. ? ? ?I, Trixie Dredge, am acting as transcriptionist for [employer], DO. ? ?I have reviewed the above documentation for accuracy and completeness, and I agree with the above. Marjory Sneddon, D.O. ? ?The [place] was signed into law in 2016 which includes the topic of electronic health records.  This provides immediate access to information in MyChart.  This includes consultation notes, operative notes, office notes, lab results and pathology reports.  If you have any questions about what you read please let us know at your next visit so we can discuss your concerns and take corrective action if need be.  We are right here with you. ? ? ?

## 2021-05-27 DIAGNOSIS — H35352 Cystoid macular degeneration, left eye: Secondary | ICD-10-CM | POA: Diagnosis not present

## 2021-06-04 ENCOUNTER — Ambulatory Visit (INDEPENDENT_AMBULATORY_CARE_PROVIDER_SITE_OTHER): Payer: Federal, State, Local not specified - PPO | Admitting: Family Medicine

## 2021-06-07 ENCOUNTER — Other Ambulatory Visit (INDEPENDENT_AMBULATORY_CARE_PROVIDER_SITE_OTHER): Payer: Self-pay | Admitting: Family Medicine

## 2021-06-07 DIAGNOSIS — E559 Vitamin D deficiency, unspecified: Secondary | ICD-10-CM

## 2021-06-10 ENCOUNTER — Ambulatory Visit (INDEPENDENT_AMBULATORY_CARE_PROVIDER_SITE_OTHER): Payer: Federal, State, Local not specified - PPO | Admitting: Family Medicine

## 2021-07-01 ENCOUNTER — Ambulatory Visit (INDEPENDENT_AMBULATORY_CARE_PROVIDER_SITE_OTHER): Payer: Federal, State, Local not specified - PPO | Admitting: Family Medicine

## 2021-07-15 DIAGNOSIS — H35352 Cystoid macular degeneration, left eye: Secondary | ICD-10-CM | POA: Diagnosis not present

## 2021-07-22 ENCOUNTER — Encounter: Payer: Self-pay | Admitting: Family Medicine

## 2021-07-22 ENCOUNTER — Other Ambulatory Visit (HOSPITAL_COMMUNITY)
Admission: RE | Admit: 2021-07-22 | Discharge: 2021-07-22 | Disposition: A | Discharge status: Home / Self Care | Payer: Federal, State, Local not specified - PPO | Source: Ambulatory Visit | Attending: Family Medicine | Admitting: Family Medicine

## 2021-07-22 ENCOUNTER — Ambulatory Visit (INDEPENDENT_AMBULATORY_CARE_PROVIDER_SITE_OTHER): Payer: Federal, State, Local not specified - PPO | Admitting: Family Medicine

## 2021-07-22 VITALS — BP 117/73 | HR 69 | Wt 274.4 lb

## 2021-07-22 DIAGNOSIS — Z1211 Encounter for screening for malignant neoplasm of colon: Secondary | ICD-10-CM | POA: Diagnosis not present

## 2021-07-22 DIAGNOSIS — Z1231 Encounter for screening mammogram for malignant neoplasm of breast: Secondary | ICD-10-CM

## 2021-07-22 DIAGNOSIS — Z01419 Encounter for gynecological examination (general) (routine) without abnormal findings: Secondary | ICD-10-CM | POA: Insufficient documentation

## 2021-07-22 NOTE — Progress Notes (Signed)
   GYNECOLOGY ANNUAL PREVENTATIVE CARE ENCOUNTER NOTE  Subjective:   Joann Mendez is a 61 y.o. G58P2002 female here for a routine annual gynecologic exam.  Current complaints: none.      Denies abnormal vaginal bleeding, discharge, pelvic pain, problems with intercourse or other gynecologic concerns.    Gynecologic History No LMP recorded. Patient has had a hysterectomy. Contraception: status post hysterectomy Last Pap: 2021. Results were: normal Last mammogram: 2022. Results were: normal  Health Maintenance Due  Topic Date Due   HIV Screening  Never done   Hepatitis C Screening  Never done   Zoster Vaccines- Shingrix (1 of 2) Never done   TETANUS/TDAP  01/27/2002   COLONOSCOPY (Pts 45-56yr Insurance coverage will need to be confirmed)  Never done   COVID-19 Vaccine (3 - Pfizer risk series) 06/10/2019    The following portions of the patient's history were reviewed and updated as appropriate: allergies, current medications, past family history, past medical history, past social history, past surgical history and problem list.  Review of Systems Pertinent items are noted in HPI.   Objective:  BP 117/73   Pulse 69   Wt 274 lb 6.4 oz (124.5 kg)   BMI 45.66 kg/m  CONSTITUTIONAL: Well-developed, well-nourished female in no acute distress.  HENT:  Normocephalic, atraumatic, External right and left ear normal. Oropharynx is clear and moist EYES:  No scleral icterus.  NECK: Normal range of motion, supple, no masses.  Normal thyroid.  SKIN: Skin is warm and dry. No rash noted. Not diaphoretic. No erythema. No pallor. NEUROLOGIC: Alert and oriented to person, place, and time. Normal reflexes, muscle tone coordination. No cranial nerve deficit noted. PSYCHIATRIC: Normal mood and affect. Normal behavior. Normal judgment and thought content. CARDIOVASCULAR: Normal heart rate noted, regular rhythm. 2+ distal pulses. RESPIRATORY: Effort and breath sounds normal, no problems with  respiration noted. BREASTS: Symmetric in size. No masses, skin changes, nipple drainage, or lymphadenopathy. ABDOMEN: Soft,  no distention noted.  No tenderness, rebound or guarding.  PELVIC: Normal appearing external genitalia; Mildly atrophic vaginal mucosa and cervix. No abnormal discharge noted.  Pap smear obtained.  Normal uterine size, no other palpable masses, no uterine or adnexal tenderness. MUSCULOSKELETAL: Normal range of motion.    Assessment and Plan:  1) Annual gynecologic examination with pap smear:  Will follow up results of pap smear and manage accordingly. STI screening desired No.  Routine preventative health maintenance measures emphasized. Reviewed perimenopausal symptoms and management.    1. Well woman exam with routine gynecological exam - Cytology - PAP( [place]) - MM 3D SCREEN BREAST BILATERAL; Future - Cologuard  2. Encounter for screening mammogram for malignant neoplasm of breast - MM 3D SCREEN BREAST BILATERAL; Future  3. Colon cancer screening - Cologuard    Please refer to After Visit Summary for other counseling recommendations.   Return in about 1 year (around 07/23/2022) for Yearly wellness exam.  KCaren Macadam MD, MPH, ABFM Attending Physician Center [facility]

## 2021-07-23 LAB — CYTOLOGY - PAP
Adequacy: ABSENT
Comment: NEGATIVE
Diagnosis: NEGATIVE
High risk HPV: NEGATIVE

## 2021-07-26 ENCOUNTER — Telehealth: Payer: Self-pay | Admitting: *Deleted

## 2021-07-26 NOTE — Telephone Encounter (Signed)
-----   Message from Caren Macadam, MD sent at 07/24/2021 10:42 AM EDT ----- NIL, HPV negative. Next pap in 5 years.

## 2021-07-26 NOTE — Telephone Encounter (Signed)
Pt informed of pap results.  

## 2021-08-01 DIAGNOSIS — M1712 Unilateral primary osteoarthritis, left knee: Secondary | ICD-10-CM | POA: Diagnosis not present

## 2021-08-14 ENCOUNTER — Ambulatory Visit (INDEPENDENT_AMBULATORY_CARE_PROVIDER_SITE_OTHER): Payer: Federal, State, Local not specified - PPO | Admitting: Family Medicine

## 2021-08-15 ENCOUNTER — Telehealth: Payer: Self-pay | Admitting: Family Medicine

## 2021-08-15 ENCOUNTER — Ambulatory Visit
Admission: RE | Admit: 2021-08-15 | Discharge: 2021-08-15 | Disposition: A | Discharge status: Home / Self Care | Payer: Federal, State, Local not specified - PPO | Source: Ambulatory Visit | Attending: Emergency Medicine | Admitting: Emergency Medicine

## 2021-08-15 VITALS — BP 135/72 | HR 66 | Temp 98.4°F | Resp 18

## 2021-08-15 DIAGNOSIS — W57XXXA Bitten or stung by nonvenomous insect and other nonvenomous arthropods, initial encounter: Secondary | ICD-10-CM | POA: Diagnosis not present

## 2021-08-15 DIAGNOSIS — L03116 Cellulitis of left lower limb: Secondary | ICD-10-CM | POA: Diagnosis not present

## 2021-08-15 DIAGNOSIS — S80862A Insect bite (nonvenomous), left lower leg, initial encounter: Secondary | ICD-10-CM | POA: Diagnosis not present

## 2021-08-15 MED ORDER — DOXYCYCLINE HYCLATE 100 MG PO CAPS: 100.0000 mg | ORAL_CAPSULE | Freq: Two times a day (BID) | ORAL | 0 refills | Status: DC

## 2021-08-15 NOTE — Discharge Instructions (Addendum)
Take the doxycycline as directed.  Go to the emergency department if you have worsening symptoms.  Follow up with your primary care provider.

## 2021-08-15 NOTE — Telephone Encounter (Signed)
Pt already has appt scheduled at [facility] in [place] today at 12 noon. Sending note to Dr Damita Dunnings and Janett Billow CMA.

## 2021-08-15 NOTE — Telephone Encounter (Signed)
Patient called in stating she was bit by a tick on Monday night 08/12/21. Patient stated that the area is now red, swollen, heated, and she now has a hole in the area where the tick bit her. Sent over to triage.

## 2021-08-15 NOTE — ED Provider Notes (Signed)
Joann Mendez    CSN: [id number] Arrival date & time: 08/15/21  1209      History   Chief Complaint Chief Complaint  Patient presents with   Tick Removal    HPI Joann Mendez is a 61 y.o. female.  Patient presents with a tick bite on her left lower leg 3 days ago.  She removed the tick intact at that time.  The site initially had localized redness but the redness is now spread all over her lower leg.  No fever, chills, other rash, chest pain, shortness of breath, or other symptoms.  Her medical history includes phlebitis, history of DVT, morbid obesity, neuropathy, prediabetes, varicose veins.   The history is provided by the patient and medical records.    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   Encounter for therapeutic drug monitoring    Enlargement of lymph nodes    History of stomach ulcers    Irritable bowel syndrome    Long term (current) use of anticoagulants    Mature cystic teratoma    Morbid obesity with BMI of 40.0-44.9, adult ([place])    Neuropathy    pinched nerve left hip/leg   Obesity    Other and unspecified special symptom or syndrome, not elsewhere classified    Other congenital anomaly of toes    Other fatigue    Phlebitis and thrombophlebitis of superficial vessels of lower extremities    Prediabetes    Right leg DVT 09/27/2009   anticoagulation with coumadin possibly until 3/12   Shortness of breath on exertion    Strain of right Achilles tendon 08/2009   per [facility]-no tear    Patient Active Problem List   Diagnosis Date Noted   B12 deficiency 04/29/2021    GAD (generalized anxiety disorder) 02/20/2021   Prediabetes 02/20/2021   Varicose veins of bilateral lower extremities with pain 05/05/2019   History of DVT (deep vein thrombosis) 01/22/2017   Basal cell carcinoma 01/22/2017   Class 3 severe obesity with serious comorbidity and body mass index (BMI) of 45.0 to 49.9 in adult [facility]) 01/22/2017   Healthcare maintenance 11/30/2016   Mood change 11/30/2016   History of nonmelanoma skin cancer 06/27/2015   Cough 01/10/2013   History of basal cell carcinoma 03/09/2012   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 Vira Agar)   [place]  02/2011   MOH's procedure for skin cancer on nose at [place]     x2 (last 1991)   PRE-MALIGNANT / [place]     left temporal area   RETINAL DETACHMENT SURGERY  2021   TOTAL ABDOMINAL HYSTERECTOMY W/ BILATERAL SALPINGOOPHORECTOMY  12/2007   TAH-BSO mature cystic teratoma   VARICOSE VEIN SURGERY  sclerotherapy    OB History     Gravida  2   Para  2   Term  2   Preterm      AB      Living  2      SAB      IAB      Ectopic      Multiple      Live Births  2        Obstetric Comments  IOL with G2 for NRFHR          Home Medications    Prior to Admission medications   Medication Sig Start Date End Date Taking? Authorizing Provider  aspirin EC 81 MG tablet Take by mouth.   Yes [provider]  citalopram (CELEXA) 20 MG tablet Take 1 tablet (20 mg total) by mouth daily. 05/05/19  Yes Dalia Heading, CNM  docusate sodium (COLACE) 100 MG capsule Take 100 mg by mouth daily as needed for mild constipation.   Yes [provider]  doxycycline  (VIBRAMYCIN) 100 MG capsule Take 1 capsule (100 mg total) by mouth 2 (two) times daily for 10 days. 08/15/21 08/25/21 Yes Sharion Balloon, NP  vitamin B-12 (CYANOCOBALAMIN) 1000 MCG tablet Take 1 tablet (1,000 mcg total) by mouth daily. 03/06/21  Yes Opalski, Neoma Laming, DO  Vitamin D, Ergocalciferol, (DRISDOL) 1.25 MG (50000 UNIT) CAPS capsule Take 1 capsule (50,000 Units total) by mouth every 7 (seven) days. 05/13/21  Yes Opalski, Neoma Laming, DO  prednisoLONE acetate (PRED FORTE) 1 % ophthalmic suspension Place 1 drop into both eyes in the morning, at noon, in the evening, and at bedtime. Patient not taking: Reported on 07/22/2021 07/27/20   [provider]    Family History Family History  Problem Relation Age of Onset   Anxiety disorder Mother    Heart disease Mother    Atrial fibrillation Mother    Deep vein thrombosis Mother    Clotting disorder Mother    Hypertension Mother    Stroke Mother        April 2023   Hypertension Father    Coronary artery disease Father        s/p CABG and valve replacement and repair of heart aneurysm 2012   Heart disease Father    Stroke Father        Had sepsis from liver abscess   Stomach cancer Paternal Grandmother 1   Breast cancer Cousin 66       maternal   Anxiety disorder Other    Colon cancer Neg Hx     Social History Social History   Tobacco Use   Smoking status: Never   Smokeless tobacco: Never  Vaping Use   Vaping Use: Never used  Substance Use Topics   Alcohol use: No   Drug use: No     Allergies   Neomycin-bacitracin zn-polymyx, Tape, Wellbutrin [bupropion], Augmentin [amoxicillin-pot clavulanate], Wound dressing adhesive, and Prednisone   Review of Systems Review of Systems  Constitutional:  Negative for chills and fever.  Musculoskeletal:  Negative for gait problem and joint swelling.  Skin:  Positive for color change and wound.  All other systems reviewed and are negative.    Physical Exam Triage Vital Signs ED  Triage Vitals  Enc Vitals Group     BP      Pulse      Resp      Temp      Temp src      SpO2  Weight      Height      Head Circumference      Peak Flow      Pain Score      Pain Loc      Pain Edu?      Excl. in [place]?    No data found.  Updated Vital Signs BP 135/72   Pulse 66   Temp 98.4 F (36.9 C)   Resp 18   SpO2 98%   Visual Acuity Right Eye Distance:   Left Eye Distance:   Bilateral Distance:    Right Eye Near:   Left Eye Near:    Bilateral Near:     Physical Exam Vitals and nursing note reviewed.  Constitutional:      General: She is not in acute distress.    Appearance: She is well-developed. She is obese. She is not ill-appearing.  HENT:     Mouth/Throat:     Mouth: Mucous membranes are moist.  Cardiovascular:     Rate and Rhythm: Normal rate and regular rhythm.     Heart sounds: Normal heart sounds.  Pulmonary:     Effort: Pulmonary effort is normal. No respiratory distress.     Breath sounds: Normal breath sounds.  Musculoskeletal:        General: Normal range of motion.     Cervical back: Neck supple.  Skin:    General: Skin is warm and dry.     Capillary Refill: Capillary refill takes less than 2 seconds.     Findings: Erythema and lesion present.     Comments: Light erythema on LLE with dime-sized bright red lesion at site of tick bite.  See pictures.   Neurological:     General: No focal deficit present.     Mental Status: She is alert and oriented to person, place, and time.     Sensory: No sensory deficit.     Motor: No weakness.     Gait: Gait normal.  Psychiatric:        Mood and Affect: Mood normal.        Behavior: Behavior normal.         UC Treatments / Results  Labs (all labs ordered are listed, but only abnormal results are displayed) Labs Reviewed - No data to display  EKG   Radiology No results found.  Procedures Procedures (including critical care time)  Medications Ordered in UC Medications - No  data to display  Initial Impression / Assessment and Plan / UC Course  I have reviewed the triage vital signs and the nursing notes.  Pertinent labs & imaging results that were available during my care of the patient were reviewed by me and considered in my medical decision making (see chart for details).    Cellulitis of left lower leg due to tick bite.  Afebrile and vital signs stable.  Treating with doxycycline.  Wound care instructions and signs of worsening infection discussed.  ED precautions discussed.  Instructed patient to follow-up with her PCP in the next 1 to 2 days for recheck.  Education provided on tick bites and cellulitis.  Patient agrees to plan of care.  Final Clinical Impressions(s) / UC Diagnoses   Final diagnoses:  Cellulitis of left lower leg  Tick bite of left lower leg, initial encounter     Discharge Instructions      Take the doxycycline as directed.  Go to the emergency department if you have worsening symptoms.  Follow up  with your primary care provider.        ED Prescriptions     Medication Sig Dispense Auth. Provider   doxycycline (VIBRAMYCIN) 100 MG capsule Take 1 capsule (100 mg total) by mouth 2 (two) times daily for 10 days. 20 capsule Sharion Balloon, NP      PDMP not reviewed this encounter.   Sharion Balloon, NP 08/15/21 [phone]

## 2021-08-15 NOTE — Telephone Encounter (Signed)
[facility] [facility] Day - Client TELEPHONE ADVICE RECORD [facility] Patient Name: CAROLEENA PAOLINI Gender: Female DOB: 01-01-1961 Age: 61 Y 2 M 3 D Return Phone Number: [phone] (Primary) Address: [address] City/ State/ Zip: [place]  [address] Client [facility] Day - Client Client Site [place] - Day Provider Renford Dills - MD Contact Type Call Who Is Calling Patient / Member / Family / Caregiver Call Type Triage / Clinical Relationship To Patient Self Return Phone Number [phone] (Primary) Chief Complaint Tick Bite Reason for Call Symptomatic / Request for [place] states she has a swollen red leg after a tick bite. It is hot and has hole where she was bitten. This was in the middle of the night Monday night. Translation No Nurse Assessment Nurse: Rolin Barry, RN, Levada Dy Date/Time (Eastern Time): 08/15/2021 9:09:47 AM Confirm and document reason for call. If symptomatic, describe symptoms. ---Caller states she has a swollen red leg after a tick bite. It is hot and has hole where she was bitten. This was in the middle of the night Monday night. [place] Does the patient have any new or worsening symptoms? ---Yes Will a triage be completed? ---Yes Related visit to physician within the last 2 weeks? ---No Does the PT have any chronic conditions? (i.e. diabetes, asthma, this includes High risk factors for pregnancy, etc.) ---No Is this a behavioral health or substance abuse call? ---No Guidelines Guideline Title Affirmed Question Affirmed Notes Nurse Date/Time (Eastern Time) Tick Bite [1] Red streak or red line AND [2] length > 2 inches (5 cm) Deaton, RN, Levada Dy 08/15/2021 9:12:22 AM Disp. Time Eilene Ghazi Time) Disposition Final User 08/15/2021 9:14:22 AM See HCP within 4 Hours (or PCP triage) Yes Deaton, RN,  Levada Dy PLEASE NOTE: All timestamps contained within this report are represented as [place] Standard Time. CONFIDENTIALTY NOTICE: This fax transmission is intended only for the addressee. It contains information that is legally privileged, confidential or otherwise protected from use or disclosure. If you are not the intended recipient, you are strictly prohibited from reviewing, disclosing, copying using or disseminating any of this information or taking any action in reliance on or regarding this information. If you have received this fax in error, please notify us immediately by telephone so that we can arrange for its return to [place]. Phone: [phone], Toll-Free: [phone], Fax: [phone] Page: 2 of 2 Call Id: [id number] Final Disposition 08/15/2021 9:14:22 AM See HCP within 4 Hours (or PCP triage) Yes Deaton, RN, Cindee Lame Disagree/Comply Comply Caller Understands Yes PreDisposition Did not know what to do Care Advice Given Per Guideline SEE HCP (OR PCP TRIAGE) WITHIN 4 HOURS: * IF OFFICE WILL BE OPEN: You need to be seen within the next 3 or 4 hours. Call your doctor (or NP/PA) now or as soon as the office opens. CALL BACK IF: * You become worse CARE ADVICE given per Tick Bites (Adult) guideline. Comments User: Saverio Danker, RN Date/Time Eilene Ghazi Time): 08/15/2021 9:21:47 AM Caller connected with Larene Beach at the backline for assistance. Referrals REFERRED TO PCP OFFIC

## 2021-08-15 NOTE — ED Triage Notes (Signed)
Pt reports tick bite three days ago to posterior L calf.  Removed tick at home, head intact.  Has used multiple tx at home, no improvement.  Swelling to area noted as well as redness from posterior knee to ankle.  No fevers but area was warm to touch.

## 2021-08-15 NOTE — Telephone Encounter (Signed)
Agree with UC eval and she has f/u scheduled here. Thanks.

## 2021-08-19 ENCOUNTER — Encounter: Payer: Self-pay | Admitting: Family Medicine

## 2021-08-19 ENCOUNTER — Ambulatory Visit: Payer: Federal, State, Local not specified - PPO | Admitting: Family Medicine

## 2021-08-19 DIAGNOSIS — L0390 Cellulitis, unspecified: Secondary | ICD-10-CM

## 2021-08-19 MED ORDER — MELOXICAM 7.5 MG PO TABS
7.5000 mg | ORAL_TABLET | Freq: Two times a day (BID) | ORAL | Status: DC
Start: 2021-08-19 — End: 2022-05-12

## 2021-08-19 MED ORDER — DOXYCYCLINE HYCLATE 100 MG PO CAPS: 100.0000 mg | ORAL_CAPSULE | Freq: Two times a day (BID) | ORAL | 0 refills | Status: DC

## 2021-08-19 MED ORDER — MUPIROCIN CALCIUM 2 % EX CREA: 1.0000 | TOPICAL_CREAM | Freq: Two times a day (BID) | CUTANEOUS | 0 refills | Status: DC

## 2021-08-19 NOTE — Progress Notes (Unsigned)
She was a 295 and is down to 268.  She is working on diet/exercise.    F/u for tick bite.  On doxycycline.  Tick had a white dot.  She had leg erythema. No fevers but leg was hot.  She is elevating her leg.  Clearly better in the meantime.    She is off meloxicam in the meantime.    She is travelling in the fall and will be aboug 100 miles from any help (in the [place] of [place]), d/w pt about having med on hand in case of an issue.    Meds, vitals, and allergies reviewed.   ROS: Per HPI unless specifically indicated in ROS section   Nad Ncat Neck supple, no LA Rrr Ctab L posterior knee. 1cm scabbed area.  Mild pinkish changes inferior but that is clearly lighter now and less tight.

## 2021-08-19 NOTE — Patient Instructions (Signed)
Finish the doxy and update me as needed.  Take care.  Glad to see you.

## 2021-08-21 DIAGNOSIS — L0390 Cellulitis, unspecified: Secondary | ICD-10-CM | POA: Insufficient documentation

## 2021-08-21 NOTE — Assessment & Plan Note (Signed)
Clearly better in the meantime, would finish the doxy and update me as needed.  Discussed with patient.  If she has return of symptoms then restart doxycycline and let me know.

## 2021-08-26 ENCOUNTER — Ambulatory Visit (INDEPENDENT_AMBULATORY_CARE_PROVIDER_SITE_OTHER): Payer: Federal, State, Local not specified - PPO | Admitting: Nurse Practitioner

## 2021-08-26 ENCOUNTER — Encounter (INDEPENDENT_AMBULATORY_CARE_PROVIDER_SITE_OTHER): Payer: Self-pay | Admitting: Nurse Practitioner

## 2021-08-26 VITALS — BP 131/77 | HR 65 | Temp 98.0°F | Ht 65.0 in | Wt 267.0 lb

## 2021-08-26 DIAGNOSIS — Z6841 Body Mass Index (BMI) 40.0 and over, adult: Secondary | ICD-10-CM | POA: Diagnosis not present

## 2021-08-26 DIAGNOSIS — E538 Deficiency of other specified B group vitamins: Secondary | ICD-10-CM | POA: Diagnosis not present

## 2021-08-26 DIAGNOSIS — E559 Vitamin D deficiency, unspecified: Secondary | ICD-10-CM

## 2021-08-26 DIAGNOSIS — E669 Obesity, unspecified: Secondary | ICD-10-CM

## 2021-08-26 MED ORDER — VITAMIN D (ERGOCALCIFEROL) 1.25 MG (50000 UNIT) PO CAPS: 50000.0000 [IU] | ORAL_CAPSULE | ORAL | 0 refills | Status: DC

## 2021-08-28 NOTE — Progress Notes (Unsigned)
Chief Complaint:   OBESITY Joann Mendez is here to discuss her progress with her obesity treatment plan along with follow-up of her obesity related diagnoses. Joann Mendez is on the Category 2 Plan and states she is following her eating plan approximately 80% of the time. Joann Mendez states she is exercising 0 minutes 0 times per week.  Today's visit was #: 6 Starting weight: 291 lbs Starting date: 02/20/2021 Today's weight: 267 lbs Today's date: 08/26/2021 Total lbs lost to date: 24 lbs Total lbs lost since last in-office visit:    Interim History: Joann Mendez has done well with weight loss since her last visit. She was seen here last on 05/13/21. Her mother had a stroke on 05/16/21. She has been staying with her mother and helping her recover. She has been traveling back and forth to [place]. She also has been struggling with left knee pain and has not been able to exercise. Currently taking Doxy due to tick bite. Notes this all has been a wake up call to lose weight. She started getting back on track 2 weeks ago.  Subjective:   1. Vitamin D deficiency Joann Mendez took Vit D last over 1 month ago. She is taking over the counter Vit D.  2. Vitamin B12 deficiency Joann Mendez is taking over the counter B12 1,000.  Assessment/Plan:   1. Vitamin D deficiency Low Vitamin D level contributes to fatigue and are associated with obesity, breast, and colon cancer. She agrees to continue to take prescription Vitamin D '@50'$ ,000 IU every week and will follow-up for routine testing of Vitamin D, at least 2-3 times per year to avoid over-replacement.  We will refill Vit D 50,000 IU for 1 month with 0 refills.  -Refill Vitamin D, Ergocalciferol, (DRISDOL) 1.25 MG (50000 UNIT) CAPS capsule; Take 1 capsule (50,000 Units total) by mouth every 7 (seven) days.  Dispense: 5 capsule; Refill: 0  2. Vitamin B12 deficiency We will recheck labs at next visit. The diagnosis was reviewed with the patient. Counseling provided today, see below. We  will continue to monitor. Orders and follow up as documented in patient record.  Counseling The body needs vitamin B12: to make red blood cells; to make DNA; and to help the nerves work properly so they can carry messages from the brain to the body.  The main causes of vitamin B12 deficiency include dietary deficiency, digestive diseases, pernicious anemia, and having a surgery in which part of the stomach or small intestine is removed.  Certain medicines can make it harder for the body to absorb vitamin B12. These medicines include: heartburn medications; some antibiotics; some medications used to treat diabetes, gout, and high cholesterol.  In some cases, there are no symptoms of this condition. If the condition leads to anemia or nerve damage, various symptoms can occur, such as weakness or fatigue, shortness of breath, and numbness or tingling in your hands and feet.   Treatment:  May include taking vitamin B12 supplements.  Avoid alcohol.  Eat lots of healthy foods that contain vitamin B12: Beef, pork, chicken, [place], and organ meats, such as liver.  Seafood: This includes clams, rainbow trout, salmon, tuna, and haddock. Eggs.  Cereal and dairy products that are fortified: This means that vitamin B12 has been added to the food.   3. Obesity with current BMI of 44.5 Joann Mendez is currently in the action stage of change. As such, her goal is to continue with weight loss efforts. She has agreed to the Category 2 Plan.  Labs will be obtained at next visit.  Exercise goals: All adults should avoid inactivity. Some physical activity is better than none, and adults who participate in any amount of physical activity gain some health benefits.  Behavioral modification strategies: increasing lean protein intake, increasing water intake, and planning for success.  Joann Mendez has agreed to follow-up with our clinic in 4 weeks. She was informed of the importance of frequent follow-up visits to maximize her  success with intensive lifestyle modifications for her multiple health conditions.   Objective:   Blood pressure 131/77, pulse 65, temperature 98 F (36.7 C), height '5\' 5"'$  (1.651 m), weight 267 lb (121.1 kg), SpO2 99 %. Body mass index is 44.43 kg/m.  General: Cooperative, alert, well developed, in no acute distress. HEENT: Conjunctivae and lids unremarkable. Cardiovascular: Regular rhythm.  Lungs: Normal work of breathing. Neurologic: No focal deficits.   Lab Results  Component Value Date   CREATININE 0.85 02/20/2021   BUN 15 02/20/2021   NA 141 02/20/2021   K 4.6 02/20/2021   CL 101 02/20/2021   CO2 23 02/20/2021   Lab Results  Component Value Date   ALT 10 02/20/2021   AST 14 02/20/2021   ALKPHOS 73 02/20/2021   BILITOT 0.5 02/20/2021   Lab Results  Component Value Date   HGBA1C 6.2 (H) 02/20/2021   HGBA1C 5.8 (H) 06/06/2019   Lab Results  Component Value Date   INSULIN 18.1 02/20/2021   Lab Results  Component Value Date   TSH 2.240 02/20/2021   Lab Results  Component Value Date   CHOL 174 02/20/2021   HDL 59 02/20/2021   LDLCALC 100 (H) 02/20/2021   TRIG 82 02/20/2021   Lab Results  Component Value Date   VD25OH 23.3 (L) 02/20/2021   Lab Results  Component Value Date   WBC 10.5 02/20/2021   HGB 13.6 02/20/2021   HCT 39.6 02/20/2021   MCV 88 02/20/2021   PLT 245 02/20/2021   No results found for: "IRON", "TIBC", "FERRITIN"  Attestation Statements:   Reviewed by clinician on day of visit: allergies, medications, problem list, medical history, surgical history, family history, social history, and previous encounter notes.  I, Brendell Tyus, RMA, am acting as transcriptionist for Everardo Pacific, FNP.  I have reviewed the above documentation for accuracy and completeness, and I agree with the above. Everardo Pacific, FNP

## 2021-09-01 LAB — COLOGUARD: COLOGUARD: NEGATIVE

## 2021-09-02 ENCOUNTER — Telehealth: Payer: Self-pay | Admitting: *Deleted

## 2021-09-02 NOTE — Telephone Encounter (Signed)
Pt informed of cologard results

## 2021-09-02 NOTE — Telephone Encounter (Signed)
-----   Message from Caren Macadam, MD sent at 09/02/2021 11:52 AM EDT ----- Cologuard negative, repeat in 1 year for CRC screening

## 2021-09-23 ENCOUNTER — Ambulatory Visit (INDEPENDENT_AMBULATORY_CARE_PROVIDER_SITE_OTHER): Payer: Federal, State, Local not specified - PPO | Admitting: Nurse Practitioner

## 2021-09-23 ENCOUNTER — Encounter (INDEPENDENT_AMBULATORY_CARE_PROVIDER_SITE_OTHER): Payer: Self-pay | Admitting: Nurse Practitioner

## 2021-09-23 VITALS — BP 137/78 | HR 69 | Temp 98.0°F | Ht 65.0 in | Wt 265.0 lb

## 2021-09-23 DIAGNOSIS — E669 Obesity, unspecified: Secondary | ICD-10-CM | POA: Diagnosis not present

## 2021-09-23 DIAGNOSIS — E559 Vitamin D deficiency, unspecified: Secondary | ICD-10-CM

## 2021-09-23 DIAGNOSIS — R7303 Prediabetes: Secondary | ICD-10-CM

## 2021-09-23 DIAGNOSIS — Z6841 Body Mass Index (BMI) 40.0 and over, adult: Secondary | ICD-10-CM

## 2021-09-23 DIAGNOSIS — E538 Deficiency of other specified B group vitamins: Secondary | ICD-10-CM | POA: Diagnosis not present

## 2021-09-23 MED ORDER — VITAMIN D (ERGOCALCIFEROL) 1.25 MG (50000 UNIT) PO CAPS: 50000.0000 [IU] | ORAL_CAPSULE | ORAL | 0 refills | Status: DC

## 2021-09-24 ENCOUNTER — Telehealth (INDEPENDENT_AMBULATORY_CARE_PROVIDER_SITE_OTHER): Payer: Self-pay | Admitting: Family Medicine

## 2021-09-24 LAB — HEMOGLOBIN A1C
Est. average glucose Bld gHb Est-mCnc: 123 mg/dL
Hgb A1c MFr Bld: 5.9 % — ABNORMAL HIGH (ref 4.8–5.6)

## 2021-09-24 LAB — VITAMIN D 25 HYDROXY (VIT D DEFICIENCY, FRACTURES): Vit D, 25-Hydroxy: 35.7 ng/mL (ref 30.0–100.0)

## 2021-09-24 LAB — INSULIN, RANDOM: INSULIN: 13.8 u[IU]/mL (ref 2.6–24.9)

## 2021-09-24 LAB — VITAMIN B12: Vitamin B-12: 498 pg/mL (ref 232–1245)

## 2021-09-24 NOTE — Telephone Encounter (Signed)
Pt called and stated that Dr. Jenetta Downer was going to call  her about her test results. Pt would like a call concerning her results.

## 2021-09-25 NOTE — Telephone Encounter (Signed)
Spoke with patient and advised that there are any concerns re: labs, she will be called. Otherwise, labs will be discussed at next office visit.

## 2021-09-26 DIAGNOSIS — M1712 Unilateral primary osteoarthritis, left knee: Secondary | ICD-10-CM | POA: Diagnosis not present

## 2021-09-30 NOTE — Progress Notes (Signed)
Chief Complaint:   OBESITY Joann Mendez is here to discuss her progress with her obesity treatment plan along with follow-up of her obesity related diagnoses. Joann Mendez is on the Category 2 Plan and states she is following her eating plan approximately 85% of the time. Joann Mendez states she is walking, cardio 20 minutes 3 times per week.  Today's visit was #: 7 Starting weight: 291 lbs Starting date: 02/20/2021 Today's weight: 265 lbs Today's date: 09/23/2021 Total lbs lost to date: 26 lbs Total lbs lost since last in-office visit: 2 lbs  Interim History: Doing well with Category 2 plan.  This month has been hard.  Struggling with meeting protein goals.  Trying to drink more water.  Drinking zero calorie tea and lemonade.  Struggling with knee pain since July.  Has seen ortho and had cortisone injection.  Started hurting again 2 weeks ago.    Subjective:   1. Vitamin D deficiency She is currently taking prescription vitamin D 50,000 IU each week. She denies nausea, vomiting or muscle weakness.  2. Vitamin B12 deficiency Taking Vitamin B12 1000 mcg daily.  Denies side effects.   3. Pre-diabetes Has never been on medication.   Assessment/Plan:   1. Vitamin D deficiency Refill - Vitamin D, Ergocalciferol, (DRISDOL) 1.25 MG (50000 UNIT) CAPS capsule; Take 1 capsule (50,000 Units total) by mouth every 7 (seven) days.  Dispense: 5 capsule; Refill: 0  Low Vitamin D level contributes to fatigue and are associated with obesity, breast, and colon cancer. She agrees to continue to take prescription Vitamin D '@50'$ ,000 IU every week and will follow-up for routine testing of Vitamin D, at least 2-3 times per year to avoid over-replacement.   Check labs today  - VITAMIN D 25 Hydroxy (Vit-D Deficiency, Fractures)  2. Vitamin B12 deficiency Check labs today  - Vitamin B12  3. Pre-diabetes Information on pre-diabetes and metformin given to patient today.  To discuss and review labs at next visit.    Check labs today  - Hemoglobin A1c - Insulin, random  4. Obesity with current BMI of 44.2 Labs obtained today.  Going on vacation in October.  CMP reviewed from work obtained on 09/03/2021.   Joann Mendez is currently in the action stage of change. As such, her goal is to continue with weight loss efforts. She has agreed to the Category 2 Plan.   Exercise goals:  swimming, pool.  Behavioral modification strategies: increasing lean protein intake, increasing vegetables, and increasing water intake.  Joann Mendez has agreed to follow-up with our clinic in 4 weeks. She was informed of the importance of frequent follow-up visits to maximize her success with intensive lifestyle modifications for her multiple health conditions.   Objective:   Blood pressure 137/78, pulse 69, temperature 98 F (36.7 C), height '5\' 5"'$  (1.651 m), weight 265 lb (120.2 kg), SpO2 98 %. Body mass index is 44.1 kg/m.  General: Cooperative, alert, well developed, in no acute distress. HEENT: Conjunctivae and lids unremarkable. Cardiovascular: Regular rhythm.  Lungs: Normal work of breathing. Neurologic: No focal deficits.   Lab Results  Component Value Date   CREATININE 0.85 02/20/2021   BUN 15 02/20/2021   NA 141 02/20/2021   K 4.6 02/20/2021   CL 101 02/20/2021   CO2 23 02/20/2021   Lab Results  Component Value Date   ALT 10 02/20/2021   AST 14 02/20/2021   ALKPHOS 73 02/20/2021   BILITOT 0.5 02/20/2021   Lab Results  Component Value Date  HGBA1C 5.9 (H) 09/23/2021   HGBA1C 6.2 (H) 02/20/2021   HGBA1C 5.8 (H) 06/06/2019   Lab Results  Component Value Date   INSULIN 13.8 09/23/2021   INSULIN 18.1 02/20/2021   Lab Results  Component Value Date   TSH 2.240 02/20/2021   Lab Results  Component Value Date   CHOL 174 02/20/2021   HDL 59 02/20/2021   LDLCALC 100 (H) 02/20/2021   TRIG 82 02/20/2021   Lab Results  Component Value Date   VD25OH 35.7 09/23/2021   VD25OH 23.3 (L) 02/20/2021   Lab  Results  Component Value Date   WBC 10.5 02/20/2021   HGB 13.6 02/20/2021   HCT 39.6 02/20/2021   MCV 88 02/20/2021   PLT 245 02/20/2021   No results found for: "IRON", "TIBC", "FERRITIN"   Attestation Statements:   Reviewed by clinician on day of visit: allergies, medications, problem list, medical history, surgical history, family history, social history, and previous encounter notes.  I, Davy Pique, RMA, am acting as transcriptionist for Everardo Pacific, FNP  I have reviewed the above documentation for accuracy and completeness, and I agree with the above. Everardo Pacific, FNP

## 2021-10-02 DIAGNOSIS — Z1231 Encounter for screening mammogram for malignant neoplasm of breast: Secondary | ICD-10-CM | POA: Diagnosis not present

## 2021-10-14 DIAGNOSIS — H35352 Cystoid macular degeneration, left eye: Secondary | ICD-10-CM | POA: Diagnosis not present

## 2021-10-23 ENCOUNTER — Ambulatory Visit (INDEPENDENT_AMBULATORY_CARE_PROVIDER_SITE_OTHER): Payer: Federal, State, Local not specified - PPO | Admitting: Family Medicine

## 2021-10-23 ENCOUNTER — Encounter (INDEPENDENT_AMBULATORY_CARE_PROVIDER_SITE_OTHER): Payer: Self-pay | Admitting: Family Medicine

## 2021-10-23 VITALS — BP 135/77 | HR 82 | Temp 98.1°F | Ht 65.0 in | Wt 264.0 lb

## 2021-10-23 DIAGNOSIS — R7303 Prediabetes: Secondary | ICD-10-CM | POA: Diagnosis not present

## 2021-10-23 DIAGNOSIS — E538 Deficiency of other specified B group vitamins: Secondary | ICD-10-CM | POA: Diagnosis not present

## 2021-10-23 DIAGNOSIS — E559 Vitamin D deficiency, unspecified: Secondary | ICD-10-CM | POA: Diagnosis not present

## 2021-10-23 DIAGNOSIS — E669 Obesity, unspecified: Secondary | ICD-10-CM | POA: Diagnosis not present

## 2021-10-23 DIAGNOSIS — Z6841 Body Mass Index (BMI) 40.0 and over, adult: Secondary | ICD-10-CM

## 2021-10-23 MED ORDER — METFORMIN HCL 500 MG PO TABS: ORAL_TABLET | ORAL | 0 refills | Status: DC

## 2021-10-23 MED ORDER — VITAMIN D (ERGOCALCIFEROL) 1.25 MG (50000 UNIT) PO CAPS: ORAL_CAPSULE | ORAL | 0 refills | Status: DC

## 2021-10-26 NOTE — Progress Notes (Signed)
Chief Complaint:   OBESITY Joann Mendez is here to discuss her progress with her obesity treatment plan along with follow-up of her obesity related diagnoses. Joann Mendez is on the Category 2 Plan and states she is following her eating plan approximately 50% of the time. Joann Mendez states she is not currently exercising.  Today's visit was #: 8 Starting weight: 291 lbs Starting date: 02/20/2021 Today's weight: 264 lbs Today's date: 10/23/2021 Total lbs lost to date: 27 Total lbs lost since last in-office visit: 1  Interim History: This is Joann Mendez's first OV with me since 05/13/2021. She was in [place] for 7-8 weeks due to her Mom having CVA. Joann Mendez has not felt focused on her own health/meal plan for several months. She is drinking 40 oz of water per day now. Pt is having knee problems and can't exercise.  Subjective:   1. Prediabetes Discussed labs with patient today. Joann Mendez's A1c has improved slightly from 6.2 to 5.8, and her fasting insulin went from 18.1 to 13.8. Pt has some cravings/snacking.  2. Vitamin D deficiency Discussed labs with patient today. Joann Mendez has been consistent with OTC 2000 IU daily and once weekly Ergocalciferol.  3. Vitamin B12 deficiency Discussed labs with patient today. Pt is taking OTC B12 1000 mcg daily.  Assessment/Plan:  No orders of the defined types were placed in this encounter.   Medications Discontinued During This Encounter  Medication Reason   mupirocin cream (BACTROBAN) 2 % Completed Course   doxycycline (VIBRAMYCIN) 100 MG capsule Completed Course   Vitamin D, Ergocalciferol, (DRISDOL) 1.25 MG (50000 UNIT) CAPS capsule Reorder     Meds ordered this encounter  Medications   Vitamin D, Ergocalciferol, (DRISDOL) 1.25 MG (50000 UNIT) CAPS capsule    Sig: 1 po q Thursday and 1 po q Sun    Dispense:  8 capsule    Refill:  0   metFORMIN (GLUCOPHAGE) 500 MG tablet    Sig: 1/2 po with lunch daily    Dispense:  30 tablet    Refill:  0    30 d supply;  ** OV for RF  **   Do not send RF request     1. Prediabetes Improving A1c and fasting insulin. Kemani will continue to work on weight loss, exercise, and decreasing simple carbohydrates to help decrease the risk of diabetes.   Start- metFORMIN (GLUCOPHAGE) 500 MG tablet; 1/2 po with lunch daily  Dispense: 30 tablet; Refill: 0  2. Vitamin D deficiency Low Vitamin D level contributes to fatigue and are associated with obesity, breast, and colon cancer. She agrees to continue to discontinue OTC dose and increase prescription Vitamin D 50,000 IU every Thursday and every Sunday and will follow-up for routine testing of Vitamin D, at least 2-3 times per year to avoid over-replacement.  Increase & Refill- Vitamin D, Ergocalciferol, (DRISDOL) 1.25 MG (50000 UNIT) CAPS capsule; 1 po q Thursday and 1 po q Sun  Dispense: 8 capsule; Refill: 0  3. Vitamin B12 deficiency B12 at goal. The diagnosis was reviewed with the patient. Counseling provided today, see below. We will continue to monitor. Orders and follow up as documented in patient record. Continue OTC B12 supplementation.  Counseling The body needs vitamin B12: to make red blood cells; to make DNA; and to help the nerves work properly so they can carry messages from the brain to the body.  The main causes of vitamin B12 deficiency include dietary deficiency, digestive diseases, pernicious anemia, and having a surgery in  which part of the stomach or small intestine is removed.  Certain medicines can make it harder for the body to absorb vitamin B12. These medicines include: heartburn medications; some antibiotics; some medications used to treat diabetes, gout, and high cholesterol.  In some cases, there are no symptoms of this condition. If the condition leads to anemia or nerve damage, various symptoms can occur, such as weakness or fatigue, shortness of breath, and numbness or tingling in your hands and feet.   Treatment:  May include taking vitamin B12  supplements.  Avoid alcohol.  Eat lots of healthy foods that contain vitamin B12: Beef, pork, chicken, [place], and organ meats, such as liver.  Seafood: This includes clams, rainbow trout, salmon, tuna, and haddock. Eggs.  Cereal and dairy products that are fortified: This means that vitamin B12 has been added to the food.   4. Obesity with current BMI of 25 Joann Mendez is currently in the action stage of change. As such, her goal is to continue with weight loss efforts. She has agreed to the Category 2 Plan.   Joann Mendez [place].  Strategies discussed with pt to succeed.  Exercise goals:  As is  Behavioral modification strategies: increasing lean protein intake, decreasing simple carbohydrates, and planning for success.  Joann Mendez has agreed to follow-up with our clinic in 3-4 weeks after vacation. She was informed of the importance of frequent follow-up visits to maximize her success with intensive lifestyle modifications for her multiple health conditions.   Objective:   Blood pressure 135/77, pulse 82, temperature 98.1 F (36.7 C), height '5\' 5"'$  (1.651 m), weight 264 lb (119.7 kg), SpO2 94 %. Body mass index is 43.93 kg/m.  General: Cooperative, alert, well developed, in no acute distress. HEENT: Conjunctivae and lids unremarkable. Cardiovascular: Regular rhythm.  Lungs: Normal work of breathing. Neurologic: No focal deficits.   Lab Results  Component Value Date   CREATININE 0.85 02/20/2021   BUN 15 02/20/2021   NA 141 02/20/2021   K 4.6 02/20/2021   CL 101 02/20/2021   CO2 23 02/20/2021   Lab Results  Component Value Date   ALT 10 02/20/2021   AST 14 02/20/2021   ALKPHOS 73 02/20/2021   BILITOT 0.5 02/20/2021   Lab Results  Component Value Date   HGBA1C 5.9 (H) 09/23/2021   HGBA1C 6.2 (H) 02/20/2021   HGBA1C 5.8 (H) 06/06/2019   Lab Results  Component Value Date   INSULIN 13.8 09/23/2021   INSULIN 18.1 02/20/2021   Lab  Results  Component Value Date   TSH 2.240 02/20/2021   Lab Results  Component Value Date   CHOL 174 02/20/2021   HDL 59 02/20/2021   LDLCALC 100 (H) 02/20/2021   TRIG 82 02/20/2021   Lab Results  Component Value Date   VD25OH 35.7 09/23/2021   VD25OH 23.3 (L) 02/20/2021   Lab Results  Component Value Date   WBC 10.5 02/20/2021   HGB 13.6 02/20/2021   HCT 39.6 02/20/2021   MCV 88 02/20/2021   PLT 245 02/20/2021    Attestation Statements:   Reviewed by clinician on day of visit: allergies, medications, problem list, medical history, surgical history, family history, social history, and previous encounter notes.  Time spent on visit including pre-visit chart review and post-visit care and charting was 40 minutes.   I, Kathlene November, BS, CMA, am acting as transcriptionist for [employer], DO.   I have reviewed the above  documentation for accuracy and completeness, and I agree with the above. Marjory Sneddon, D.O.  The [place] was signed into law in 2016 which includes the topic of electronic health records.  This provides immediate access to information in MyChart.  This includes consultation notes, operative notes, office notes, lab results and pathology reports.  If you have any questions about what you read please let us know at your next visit so we can discuss your concerns and take corrective action if need be.  We are right here with you.

## 2021-11-14 ENCOUNTER — Other Ambulatory Visit (INDEPENDENT_AMBULATORY_CARE_PROVIDER_SITE_OTHER): Payer: Self-pay | Admitting: Family Medicine

## 2021-11-14 DIAGNOSIS — E559 Vitamin D deficiency, unspecified: Secondary | ICD-10-CM

## 2021-11-19 ENCOUNTER — Encounter (INDEPENDENT_AMBULATORY_CARE_PROVIDER_SITE_OTHER): Payer: Self-pay | Admitting: Adult Health

## 2021-11-19 ENCOUNTER — Ambulatory Visit (INDEPENDENT_AMBULATORY_CARE_PROVIDER_SITE_OTHER): Payer: Federal, State, Local not specified - PPO | Admitting: Adult Health

## 2021-11-19 VITALS — BP 126/82 | HR 62 | Temp 97.7°F | Ht 65.0 in | Wt 261.0 lb

## 2021-11-19 DIAGNOSIS — E559 Vitamin D deficiency, unspecified: Secondary | ICD-10-CM

## 2021-11-19 DIAGNOSIS — Z6841 Body Mass Index (BMI) 40.0 and over, adult: Secondary | ICD-10-CM

## 2021-11-19 DIAGNOSIS — E669 Obesity, unspecified: Secondary | ICD-10-CM

## 2021-11-19 DIAGNOSIS — R7303 Prediabetes: Secondary | ICD-10-CM | POA: Diagnosis not present

## 2021-11-19 MED ORDER — VITAMIN D (ERGOCALCIFEROL) 1.25 MG (50000 UNIT) PO CAPS: ORAL_CAPSULE | ORAL | 0 refills | Status: DC

## 2021-11-20 ENCOUNTER — Ambulatory Visit (INDEPENDENT_AMBULATORY_CARE_PROVIDER_SITE_OTHER): Payer: Federal, State, Local not specified - PPO | Admitting: Adult Health

## 2021-11-23 NOTE — Progress Notes (Unsigned)
Chief Complaint:   OBESITY Joann Mendez is here to discuss her progress with her obesity treatment plan along with follow-up of her obesity related diagnoses. Joann Mendez is on the Category 2 Plan and states she is following her eating plan approximately 80-85% of the time. Joann Mendez states she is not exercising.   Today's visit was #: 9 Starting weight: 291 lbs Starting date: 02/20/2021 Today's weight: 261 lbs Today's date: 11/19/2021 Total lbs lost to date: 30 lbs Total lbs lost since last in-office visit: 3 lbs  Interim History: She has had only 4 days of metformin 500 mg 1/2 at lunch.  She experienced weight based basis, excluded to ride in at current level. She was placed on  largest and slowest horse vacation in [place].  Subjective:   1. Vitamin D deficiency 10/23/2021, Ergocalciferol increased from weekly to twice weekly.   2. Prediabetes 10/23/2021, started on metformin, has only had four days of metformin 500 mg 1/2 tablet at lunch.  (sporadic) A1c ***  Assessment/Plan:   1. Vitamin D deficiency Refill - Vitamin D, Ergocalciferol, (DRISDOL) 1.25 MG (50000 UNIT) CAPS capsule; 1 po q Thursday and 1 po q Sun  Dispense: 8 capsule; Refill: 0  2. Prediabetes Continue metformin 500 mg 1/2 tablet daily, set an alarm lunch dose. No refill needed today.  Check CMP after 3 months of consistent metformin therapy.   3. Obesity with current BMI of 43.5 Joann Mendez is currently in the action stage of change. As such, her goal is to continue with weight loss efforts. She has agreed to the Category 2 Plan.   Exercise goals:  As is.   Behavioral modification strategies: increasing lean protein intake, decreasing simple carbohydrates, meal planning and cooking strategies, keeping healthy foods in the home, and planning for success.  Joann Mendez has agreed to follow-up with our clinic in 4 weeks. She was informed of the importance of frequent follow-up visits to maximize her success with intensive lifestyle  modifications for her multiple health conditions.   Objective:   Blood pressure 126/82, pulse 62, temperature 97.7 F (36.5 C), height '5\' 5"'$  (1.651 m), weight 261 lb (118.4 kg), SpO2 100 %. Body mass index is 43.43 kg/m.  General: Cooperative, alert, well developed, in no acute distress. HEENT: Conjunctivae and lids unremarkable. Cardiovascular: Regular rhythm.  Lungs: Normal work of breathing. Neurologic: No focal deficits.   Lab Results  Component Value Date   CREATININE 0.85 02/20/2021   BUN 15 02/20/2021   NA 141 02/20/2021   K 4.6 02/20/2021   CL 101 02/20/2021   CO2 23 02/20/2021   Lab Results  Component Value Date   ALT 10 02/20/2021   AST 14 02/20/2021   ALKPHOS 73 02/20/2021   BILITOT 0.5 02/20/2021   Lab Results  Component Value Date   HGBA1C 5.9 (H) 09/23/2021   HGBA1C 6.2 (H) 02/20/2021   HGBA1C 5.8 (H) 06/06/2019   Lab Results  Component Value Date   INSULIN 13.8 09/23/2021   INSULIN 18.1 02/20/2021   Lab Results  Component Value Date   TSH 2.240 02/20/2021   Lab Results  Component Value Date   CHOL 174 02/20/2021   HDL 59 02/20/2021   LDLCALC 100 (H) 02/20/2021   TRIG 82 02/20/2021   Lab Results  Component Value Date   VD25OH 35.7 09/23/2021   VD25OH 23.3 (L) 02/20/2021   Lab Results  Component Value Date   WBC 10.5 02/20/2021   HGB 13.6 02/20/2021   HCT 39.6 02/20/2021  MCV 88 02/20/2021   PLT 245 02/20/2021   No results found for: "IRON", "TIBC", "FERRITIN"  Attestation Statements:   Reviewed by clinician on day of visit: allergies, medications, problem list, medical history, surgical history, family history, social history, and previous encounter notes.  I, Davy Pique, RMA, am acting as Location manager for Mina Marble, NP.  I have reviewed the above documentation for accuracy and completeness, and I agree with the above. -  ***

## 2021-12-12 ENCOUNTER — Other Ambulatory Visit (INDEPENDENT_AMBULATORY_CARE_PROVIDER_SITE_OTHER): Payer: Self-pay | Admitting: Adult Health

## 2021-12-12 DIAGNOSIS — E559 Vitamin D deficiency, unspecified: Secondary | ICD-10-CM

## 2021-12-16 ENCOUNTER — Other Ambulatory Visit (INDEPENDENT_AMBULATORY_CARE_PROVIDER_SITE_OTHER): Payer: Self-pay | Admitting: Family Medicine

## 2021-12-16 DIAGNOSIS — R7303 Prediabetes: Secondary | ICD-10-CM

## 2021-12-18 ENCOUNTER — Ambulatory Visit (INDEPENDENT_AMBULATORY_CARE_PROVIDER_SITE_OTHER): Payer: Federal, State, Local not specified - PPO | Admitting: Family Medicine

## 2021-12-23 ENCOUNTER — Ambulatory Visit (INDEPENDENT_AMBULATORY_CARE_PROVIDER_SITE_OTHER): Payer: Federal, State, Local not specified - PPO | Admitting: Family Medicine

## 2021-12-30 ENCOUNTER — Ambulatory Visit (INDEPENDENT_AMBULATORY_CARE_PROVIDER_SITE_OTHER): Payer: Federal, State, Local not specified - PPO | Admitting: Family Medicine

## 2022-01-07 ENCOUNTER — Encounter: Payer: Self-pay | Admitting: Family Medicine

## 2022-01-07 ENCOUNTER — Ambulatory Visit: Payer: Federal, State, Local not specified - PPO | Admitting: Family Medicine

## 2022-01-07 VITALS — BP 110/68 | HR 86 | Temp 98.3°F | Ht 65.0 in | Wt 263.0 lb

## 2022-01-07 DIAGNOSIS — J111 Influenza due to unidentified influenza virus with other respiratory manifestations: Secondary | ICD-10-CM | POA: Diagnosis not present

## 2022-01-07 MED ORDER — ALBUTEROL SULFATE HFA 108 (90 BASE) MCG/ACT IN AERS: 2.0000 | INHALATION_SPRAY | Freq: Four times a day (QID) | RESPIRATORY_TRACT | 1 refills | Status: DC | PRN

## 2022-01-07 MED ORDER — OSELTAMIVIR PHOSPHATE 75 MG PO CAPS: 75.0000 mg | ORAL_CAPSULE | Freq: Two times a day (BID) | ORAL | 0 refills | Status: DC

## 2022-01-07 MED ORDER — BENZONATATE 200 MG PO CAPS: 200.0000 mg | ORAL_CAPSULE | Freq: Three times a day (TID) | ORAL | 1 refills | Status: DC | PRN

## 2022-01-07 MED ORDER — HYDROCODONE BIT-HOMATROP MBR 5-1.5 MG/5ML PO SOLN: 5.0000 mL | Freq: Three times a day (TID) | ORAL | 0 refills | Status: DC | PRN

## 2022-01-07 NOTE — Assessment & Plan Note (Signed)
Presumed, typical sx.  Would treat given known exposure and defer testing given that it like wouldn't change the plan. Patient agreed.  Rest and fluids.  Albuterol, tessalon, cough syrup (sedation caution). Tamiflu twice a day.  Update me as needed.    She and her husband had other medical appointments scheduled this week.  I asked her to check with the outside clinics about that.

## 2022-01-07 NOTE — Progress Notes (Signed)
Daughter got sick first, was neg for covid and positive for flu.  D/w pt.  She had close contact.    Fever started 2 days ago, cough.  Fatigued.  Has been in bed rest in the meantime.  Taking tylenol in the meantime.  Sweats, then chills.  Cough with brown sputum. Diffuse aches.   Husband is sick with similar.  He is 1 day behind patient.    She had flu shot.    Meds, vitals, and allergies reviewed.   ROS: Per HPI unless specifically indicated in ROS section   Nad Ncat Skin well perfused.  Neck supple, no LA Rrr Ctab, dry cough noted.  Ext w/o edema.

## 2022-01-07 NOTE — Patient Instructions (Signed)
Rest and fluids.  Albuterol, tessalon, cough syrup (sedation caution). Tamiflu twice a day.  Take care.  Glad to see you.

## 2022-01-13 ENCOUNTER — Ambulatory Visit (INDEPENDENT_AMBULATORY_CARE_PROVIDER_SITE_OTHER): Payer: Federal, State, Local not specified - PPO | Admitting: Physician Assistant

## 2022-01-13 DIAGNOSIS — Z961 Presence of intraocular lens: Secondary | ICD-10-CM | POA: Diagnosis not present

## 2022-01-13 DIAGNOSIS — H35352 Cystoid macular degeneration, left eye: Secondary | ICD-10-CM | POA: Diagnosis not present

## 2022-01-13 DIAGNOSIS — H3589 Other specified retinal disorders: Secondary | ICD-10-CM | POA: Diagnosis not present

## 2022-01-21 ENCOUNTER — Ambulatory Visit (INDEPENDENT_AMBULATORY_CARE_PROVIDER_SITE_OTHER): Payer: Federal, State, Local not specified - PPO | Admitting: Physician Assistant

## 2022-01-21 ENCOUNTER — Encounter (INDEPENDENT_AMBULATORY_CARE_PROVIDER_SITE_OTHER): Payer: Self-pay | Admitting: Physician Assistant

## 2022-01-21 VITALS — BP 117/80 | HR 80 | Temp 97.9°F | Ht 65.0 in | Wt 265.0 lb

## 2022-01-21 DIAGNOSIS — Z6841 Body Mass Index (BMI) 40.0 and over, adult: Secondary | ICD-10-CM

## 2022-01-21 DIAGNOSIS — R7303 Prediabetes: Secondary | ICD-10-CM

## 2022-01-21 DIAGNOSIS — E669 Obesity, unspecified: Secondary | ICD-10-CM

## 2022-01-21 DIAGNOSIS — F439 Reaction to severe stress, unspecified: Secondary | ICD-10-CM

## 2022-01-21 DIAGNOSIS — E559 Vitamin D deficiency, unspecified: Secondary | ICD-10-CM

## 2022-01-21 MED ORDER — VITAMIN D (ERGOCALCIFEROL) 1.25 MG (50000 UNIT) PO CAPS: ORAL_CAPSULE | ORAL | 0 refills | Status: DC

## 2022-01-30 ENCOUNTER — Ambulatory Visit (INDEPENDENT_AMBULATORY_CARE_PROVIDER_SITE_OTHER): Payer: Federal, State, Local not specified - PPO | Admitting: Family Medicine

## 2022-02-03 ENCOUNTER — Ambulatory Visit (INDEPENDENT_AMBULATORY_CARE_PROVIDER_SITE_OTHER): Payer: Federal, State, Local not specified - PPO | Admitting: Family Medicine

## 2022-02-06 NOTE — Progress Notes (Signed)
Chief Complaint:   OBESITY Joann Mendez is here to discuss her progress with her obesity treatment plan along with follow-up of her obesity related diagnoses. Joann Mendez is on the Category 2 Plan and states she is following her eating plan approximately 0% of the time. Joann Mendez states she is exercising 0 minutes 0 times per week.  Today's visit was #: 10 Starting weight: 291 lbs Starting date: 02/20/2021 Today's weight: 265 lbs Today's date: 01/21/2022 Total lbs lost to date: 26 lbs Total lbs lost since last in-office visit: 0  Interim History: Joann Mendez has struggled with staying on plan due to an upper respiratory tract infection. She reports getting off track over the holidays as well.  She is working to get back on track with her prescribed nutrition plan. She also reports significant increased stress related to her husband's health status.  She reports that her husband is going to require coronary artery bypass grafting and a valve replacement in the near future..  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   She is anticipating increased needs for dining out/grab and go type meals over the coming weeks.  We discussed journaling with calorie goals of 1200 cal daily with 85+ grams of protein versus category 2 plan depending upon what she is able to do.  We provided handouts for dining out options and other protein options today.  Subjective:   1. Prediabetes Joann Mendez's A1c was 5.9/insulin was 13.8 on 09/23/2021-not at goal.  Has not been consistently taking Metformin. Denies any side effects.  Reports no refills needed today.  We discussed getting back on track to decrease simple carbs, increase lean protein and increase water intake and exercise to promote weight loss..  2. Vitamin D deficiency Joann Mendez is taking ergocalciferol 50,000 IU twice weekly.  Vitamin D level of 35.7 on 09/23/2021-not at goal.  Denies side effects  3. Stress Joann Mendez has been  under a lot of stress over the past year and now increased stress with husband's upcoming surgery.  Discussed strategies to help decrease stress, focusing on taking time for herself and leaning on family and others for help with her husband's upcoming surgery.    Assessment/Plan:   1. Prediabetes Continue taking metformin.  Continue Prescribed Nutrition Plan to decrease simple carbohydrates, increase lean protein and exercise to promote weight loss..  2. Vitamin D deficiency Continue/Refill Ergocalciferol 50,000 IU Tysabri once a week for 1 month with 0 refills.  -Refill Vitamin D, Ergocalciferol, (DRISDOL) 1.25 MG (50000 UNIT) CAPS capsule; 1 po q Thursday and 1 po q Sun  Dispense: 8 capsule; Refill: 0  3. Stress Focus on taking time for herself and leaning on family/others for help with husband's upcoming surgery. The strategies may help her to decrease emotional eating behaviors to help promote weight loss. 4. Obesity with current BMI of 44.2 Joann Mendez is currently in the action stage of change. As such, her goal is to continue with weight loss efforts. She has agreed to the Category 2 Plan and keeping a food journal and adhering to recommended goals of 1200 calories and 85+ grams of protein daily.   Handouts provided: Discussed Dining out options and other protein options.  Exercise goals: All adults should avoid inactivity. Some physical activity is better than none, and adults who participate in any amount of physical activity gain some health benefits.  Behavioral modification strategies: increasing lean protein intake, decreasing simple carbohydrates, increasing water intake, no skipping  meals, better snacking choices, dealing with family or coworker sabotage, and planning for success.  Joann Mendez has agreed to follow-up with our clinic in 4 weeks. She was informed of the importance of frequent follow-up visits to maximize her success with intensive lifestyle modifications for her multiple  health conditions.   Objective:   Blood pressure 117/80, pulse 80, temperature 97.9 F (36.6 C), height '5\' 5"'$  (1.651 m), weight 265 lb (120.2 kg), SpO2 98 %. Body mass index is 44.1 kg/m.  General: Cooperative, alert, well developed, in no acute distress. HEENT: Conjunctivae and lids unremarkable. Cardiovascular: Regular rhythm.  Lungs: Normal work of breathing. Neurologic: No focal deficits.   Lab Results  Component Value Date   CREATININE 0.85 02/20/2021   BUN 15 02/20/2021   NA 141 02/20/2021   K 4.6 02/20/2021   CL 101 02/20/2021   CO2 23 02/20/2021   Lab Results  Component Value Date   ALT 10 02/20/2021   AST 14 02/20/2021   ALKPHOS 73 02/20/2021   BILITOT 0.5 02/20/2021   Lab Results  Component Value Date   HGBA1C 5.9 (H) 09/23/2021   HGBA1C 6.2 (H) 02/20/2021   HGBA1C 5.8 (H) 06/06/2019   Lab Results  Component Value Date   INSULIN 13.8 09/23/2021   INSULIN 18.1 02/20/2021   Lab Results  Component Value Date   TSH 2.240 02/20/2021   Lab Results  Component Value Date   CHOL 174 02/20/2021   HDL 59 02/20/2021   LDLCALC 100 (H) 02/20/2021   TRIG 82 02/20/2021   Lab Results  Component Value Date   VD25OH 35.7 09/23/2021   VD25OH 23.3 (L) 02/20/2021   Lab Results  Component Value Date   WBC 10.5 02/20/2021   HGB 13.6 02/20/2021   HCT 39.6 02/20/2021   MCV 88 02/20/2021   PLT 245 02/20/2021   No results found for: "IRON", "TIBC", "FERRITIN"  Attestation Statements:   Reviewed by clinician on day of visit: allergies, medications, problem list, medical history, surgical history, family history, social history, and previous encounter notes.  I, Brendell Tyus, am acting as transcriptionist for [employer], PA.  I have reviewed the above documentation for accuracy and completeness, and I agree with the above. -  Karolyne Timmons,PA-C

## 2022-02-10 ENCOUNTER — Ambulatory Visit (INDEPENDENT_AMBULATORY_CARE_PROVIDER_SITE_OTHER): Payer: Federal, State, Local not specified - PPO | Admitting: Family Medicine

## 2022-02-16 ENCOUNTER — Other Ambulatory Visit (INDEPENDENT_AMBULATORY_CARE_PROVIDER_SITE_OTHER): Payer: Self-pay | Admitting: Physician Assistant

## 2022-02-16 DIAGNOSIS — E559 Vitamin D deficiency, unspecified: Secondary | ICD-10-CM

## 2022-02-17 ENCOUNTER — Ambulatory Visit (INDEPENDENT_AMBULATORY_CARE_PROVIDER_SITE_OTHER): Payer: Federal, State, Local not specified - PPO | Admitting: Family Medicine

## 2022-05-12 ENCOUNTER — Ambulatory Visit (INDEPENDENT_AMBULATORY_CARE_PROVIDER_SITE_OTHER): Payer: Federal, State, Local not specified - PPO | Admitting: Family Medicine

## 2022-05-12 ENCOUNTER — Encounter (INDEPENDENT_AMBULATORY_CARE_PROVIDER_SITE_OTHER): Payer: Self-pay | Admitting: Family Medicine

## 2022-05-12 VITALS — BP 133/78 | HR 63 | Temp 97.8°F | Ht 65.0 in | Wt 269.0 lb

## 2022-05-12 DIAGNOSIS — Z6841 Body Mass Index (BMI) 40.0 and over, adult: Secondary | ICD-10-CM

## 2022-05-12 DIAGNOSIS — E669 Obesity, unspecified: Secondary | ICD-10-CM

## 2022-05-12 DIAGNOSIS — R7303 Prediabetes: Secondary | ICD-10-CM

## 2022-05-12 DIAGNOSIS — E559 Vitamin D deficiency, unspecified: Secondary | ICD-10-CM

## 2022-05-12 DIAGNOSIS — E66813 Obesity, class 3: Secondary | ICD-10-CM

## 2022-05-12 DIAGNOSIS — E538 Deficiency of other specified B group vitamins: Secondary | ICD-10-CM

## 2022-05-12 DIAGNOSIS — F39 Unspecified mood [affective] disorder: Secondary | ICD-10-CM | POA: Diagnosis not present

## 2022-05-12 MED ORDER — METFORMIN HCL 500 MG PO TABS: ORAL_TABLET | ORAL | 0 refills | Status: DC

## 2022-05-12 MED ORDER — VITAMIN D (ERGOCALCIFEROL) 1.25 MG (50000 UNIT) PO CAPS: ORAL_CAPSULE | ORAL | 0 refills | Status: DC

## 2022-05-12 NOTE — Progress Notes (Signed)
Joann Mendez, D.O.  ABFM, ABOM Specializing in Clinical Bariatric Medicine  Office located at: [address] [place], [place]  [address]     Assessment and Plan:   No orders of the defined types were placed in this encounter.   Medications Discontinued During This Encounter  Medication Reason   oseltamivir (TAMIFLU) 75 MG capsule Patient Preference   meloxicam (MOBIC) 7.5 MG tablet No longer needed (for PRN medications)   HYDROcodone bit-homatropine (HYCODAN) 5-1.5 MG/5ML syrup No longer needed (for PRN medications)   metFORMIN (GLUCOPHAGE) 500 MG tablet Reorder   Vitamin D, Ergocalciferol, (DRISDOL) 1.25 MG (50000 UNIT) CAPS capsule Reorder     Meds ordered this encounter  Medications   metFORMIN (GLUCOPHAGE) 500 MG tablet    Sig: 1/2 po with lunch daily    Dispense:  30 tablet    Refill:  0    30 d supply;  ** OV for RF **   Do not send RF request   Vitamin D, Ergocalciferol, (DRISDOL) 1.25 MG (50000 UNIT) CAPS capsule    Sig: 1 po q Thursday and 1 po q Sun    Dispense:  8 capsule    Refill:  0     Prediabetes Assessment: Condition is Not optimized. Lab Results  Component Value Date   HGBA1C 5.9 (H) 09/23/2021   HGBA1C 6.2 (H) 02/20/2021   HGBA1C 5.8 (H) 06/06/2019   INSULIN 13.8 09/23/2021   INSULIN 18.1 02/20/2021  Patient endorses she has not been taking her Metformin 500 mg daily, she attributes this to her stress.  Plan: Restart with med.Will refill this today.     - Continue to decrease simple carbs/ sugars; increase fiber and proteins -> follow her meal plan.   Kushi Kun will continue to work on weight loss, exercise, via their meal plan we devised to help decrease the risk of progressing to diabetes.    Vitamin D deficiency Assessment: Condition is Improving, but not optimized.. Lab Results  Component Value Date   VD25OH 35.7 09/23/2021   VD25OH 23.3 (L) 02/20/2021  Patient has not been taking her Ergocalciferol 50K IU weekly, she  attributes this to her stress.  Plan: Restart with med. Will refill this today.   - weight loss will likely improve availability of vitamin D, thus encouraged Joann Mendez to continue with meal plan and their weight loss efforts to further improve this condition.    Mood Disorder Assessment: Condition is Not optimized. No SI/HI. Patient endorses that lately she has been low in energy and has been worrying a lot because of the stress in her life. She recently had a very negative experience, which she endorses was weight related, on a recent vacation and also her husband had heart surgery.  Patient endorses she takes Celexa 10 mg prn for IBS as per PCP. Patient was on Contrave in 2019 and developed a rash and it was discontinued. She is not sure if the Contrave contained Wellbutrin.   Plan: Begin Celexa 10 mg daily (patient already has script from her PCP)  Restart her prudent nutritional plan that is low in simple carbohydrates, saturated fats and trans fats to goal of 5-10% weight loss to achieve significant health benefits.  Pt encouraged to continually advance exercise and cardiovascular fitness as tolerated throughout weight loss journey.   TREATMENT PLAN FOR OBESITY: Obesity with current BMI of 44.2 B12 deficiency due to diet Assessment: Condition is not optimized. Biometric data collected today, was reviewed with patient.  Fat mass has increased by 3.6lb. Muscle mass has increased by .2lb. Total body water has increased by 2.2 lb.   Plan:  Joann Mendez is currently in the action stage of change. As such, her goal is to continue weight management plan. Joann Mendez will work on healthier eating habits and try their best to restart with Category 2 plan and keeping a food journal and adhering to recommended goals of 1200 calories and 85+ protein  Behavioral Intervention Additional resources provided today: category 2 meal plan information Evidence-based interventions for health behavior change were utilized today  including the discussion of self monitoring techniques, problem-solving barriers and SMART goal setting techniques.   Regarding patient's less desirable eating habits and patterns, we employed the technique of small changes.  Pt will specifically work on: restarting all her medications for next visit.    Recommended Physical Activity Goals Joann Mendez has been advised to work up to 150 minutes of moderate intensity aerobic activity a week and strengthening exercises 2-3 times per week for cardiovascular health, weight loss maintenance and preservation of muscle mass.  She has agreed to Continue current level of physical activity   FOLLOW UP: Return in about 3 weeks (around 06/02/2022). She was informed of the importance of frequent follow up visits to maximize her success with intensive lifestyle modifications for her multiple health conditions.   Subjective:   Chief complaint: Obesity Joann Mendez is here to discuss her progress with her obesity treatment plan. She is on the the Category 2 Plan and keeping a food journal and adhering to recommended goals of 1200 calories and 85+ protein and states she is following her eating plan approximately 0% of the time. She states she is not exercising. Interval History:  Joann Mendez is here for a follow up office visit. Since last office visit, patient endorses that she has been struggling since returning for her Vacation in the [place] and after her husband had heart surgery. She has been feeling low in energy and has felt increased stress. During the vacation, patient endorses she was excluded from participating in a ranch, which she endorses may have been because of her weight. She wants to start again with eating healthy, but it has been difficult for her.   Pharmacotherapy for weight loss: She is currently taking Metformin for medical weight loss.  Denies side effects.    Review of Systems:  Pertinent positives were addressed with patient today.   Weight Summary  and Biometrics   Weight Lost Since Last Visit: 0  Weight Gained Since Last Visit: 4   Vitals Temp: 97.8 F (36.6 C) BP: 133/78 Pulse Rate: 63 SpO2: 98 %   Anthropometric Measurements Height:  (1.651 m) Weight: 269 lb (122 kg) BMI (Calculated): 44.76 Weight at Last Visit: 265 lb Weight Lost Since Last Visit: 0 Weight Gained Since Last Visit: 4 Starting Weight: 291 lb   Body Composition  Body Fat %: 53.7 % Fat Mass (lbs): 144.6 lbs Muscle Mass (lbs): 118.4 lbs Total Body Water (lbs): 95.8 lbs Visceral Fat Rating : 19   Other Clinical Data Fasting: n Labs: n Today's Visit #: 11 Starting Date: 02/20/21     Objective:   PHYSICAL EXAM: Blood pressure 133/78, pulse 63, temperature 97.8 F (36.6 C), height  (1.651 m), weight 269 lb (122 kg), SpO2 98 %. Body mass index is 44.76 kg/m.  General: Well Developed, well nourished, and in no acute distress.  HEENT: Normocephalic, atraumatic Skin: Warm and dry, cap RF less  2 sec, good turgor Chest:  Normal excursion, shape, no gross abn Respiratory: speaking in full sentences, no conversational dyspnea NeuroM-Sk: Ambulates w/o assistance, moves * 4 Psych: A and O *3, insight good, mood-full  DIAGNOSTIC DATA REVIEWED:  BMET    Component Value Date/Time   NA 141 02/20/2021 0951   K 4.6 02/20/2021 0951   CL 101 02/20/2021 0951   CO2 23 02/20/2021 0951   GLUCOSE 102 (H) 02/20/2021 0951   GLUCOSE 121 (H) 02/16/2013 2310   BUN 15 02/20/2021 0951   CREATININE 0.85 02/20/2021 0951   CALCIUM 9.3 02/20/2021 0951   GFRNONAA 77 (L) 02/16/2013 2310   GFRAA 90 (L) 02/16/2013 2310   Lab Results  Component Value Date   HGBA1C 5.9 (H) 09/23/2021   HGBA1C 5.8 (H) 06/06/2019   Lab Results  Component Value Date   INSULIN 13.8 09/23/2021   INSULIN 18.1 02/20/2021   Lab Results  Component Value Date   TSH 2.240 02/20/2021   CBC    Component Value Date/Time   WBC 10.5 02/20/2021 0951   WBC 14.1 (H)  02/16/2013 2310   RBC 4.52 02/20/2021 0951   RBC 4.40 02/16/2013 2310   HGB 13.6 02/20/2021 0951   HCT 39.6 02/20/2021 0951   PLT 245 02/20/2021 0951   MCV 88 02/20/2021 0951   MCH 30.1 02/20/2021 0951   MCH 30.5 02/16/2013 2310   MCHC 34.3 02/20/2021 0951   MCHC 33.3 02/16/2013 2310   RDW 12.0 02/20/2021 0951   Iron Studies No results found for: "IRON", "TIBC", "FERRITIN", "IRONPCTSAT" Lipid Panel     Component Value Date/Time   CHOL 174 02/20/2021 0951   TRIG 82 02/20/2021 0951   HDL 59 02/20/2021 0951   LDLCALC 100 (H) 02/20/2021 0951   Hepatic Function Panel     Component Value Date/Time   PROT 6.8 02/20/2021 0951   ALBUMIN 4.5 02/20/2021 0951   AST 14 02/20/2021 0951   ALT 10 02/20/2021 0951   ALKPHOS 73 02/20/2021 0951   BILITOT 0.5 02/20/2021 0951      Component Value Date/Time   TSH 2.240 02/20/2021 0951   Nutritional Lab Results  Component Value Date   VD25OH 35.7 09/23/2021   VD25OH 23.3 (L) 02/20/2021    Attestations:   Reviewed by clinician on day of visit: allergies, medications, problem list, medical history, surgical history, family history, social history, and previous encounter notes.  Patient was in the office today and time spent on visit including pre-visit chart review and post-visit care/coordination of care and electronic medical record documentation was 40 minutes. 50% of the time was in face to face counseling of this patient's medical condition(s) and providing education on treatment options to include the first-line treatment of diet and lifestyle modification.    I,Special Puri,acting as a Neurosurgeon for [employer], DO.,have documented all relevant documentation on the behalf of Thomasene Lot, DO,as directed by  Thomasene Lot, DO while in the presence of Thomasene Lot, DO.   I, Thomasene Lot, DO, have reviewed all documentation for this visit. The documentation on 05/12/22 for the exam, diagnosis, procedures, and orders are  all accurate and complete.

## 2022-05-13 DIAGNOSIS — E559 Vitamin D deficiency, unspecified: Secondary | ICD-10-CM | POA: Insufficient documentation

## 2022-05-13 DIAGNOSIS — F39 Unspecified mood [affective] disorder: Secondary | ICD-10-CM | POA: Insufficient documentation

## 2022-05-13 DIAGNOSIS — E538 Deficiency of other specified B group vitamins: Secondary | ICD-10-CM | POA: Insufficient documentation

## 2022-05-13 DIAGNOSIS — Z6841 Body Mass Index (BMI) 40.0 and over, adult: Secondary | ICD-10-CM | POA: Insufficient documentation

## 2022-05-27 DIAGNOSIS — H35352 Cystoid macular degeneration, left eye: Secondary | ICD-10-CM | POA: Diagnosis not present

## 2022-05-27 DIAGNOSIS — H3322 Serous retinal detachment, left eye: Secondary | ICD-10-CM | POA: Diagnosis not present

## 2022-05-27 DIAGNOSIS — Z961 Presence of intraocular lens: Secondary | ICD-10-CM | POA: Diagnosis not present

## 2022-05-27 DIAGNOSIS — H33001 Unspecified retinal detachment with retinal break, right eye: Secondary | ICD-10-CM | POA: Diagnosis not present

## 2022-06-02 ENCOUNTER — Ambulatory Visit (INDEPENDENT_AMBULATORY_CARE_PROVIDER_SITE_OTHER): Payer: Federal, State, Local not specified - PPO | Admitting: Physician Assistant

## 2022-06-02 ENCOUNTER — Encounter (INDEPENDENT_AMBULATORY_CARE_PROVIDER_SITE_OTHER): Payer: Self-pay | Admitting: Physician Assistant

## 2022-06-02 VITALS — BP 131/80 | HR 64 | Temp 98.4°F | Ht 65.0 in | Wt 270.0 lb

## 2022-06-02 DIAGNOSIS — R7303 Prediabetes: Secondary | ICD-10-CM | POA: Diagnosis not present

## 2022-06-02 DIAGNOSIS — F439 Reaction to severe stress, unspecified: Secondary | ICD-10-CM

## 2022-06-02 DIAGNOSIS — Z6841 Body Mass Index (BMI) 40.0 and over, adult: Secondary | ICD-10-CM

## 2022-06-02 DIAGNOSIS — E559 Vitamin D deficiency, unspecified: Secondary | ICD-10-CM

## 2022-06-02 NOTE — Progress Notes (Signed)
Office: [phone]  /  Fax: [phone]  WEIGHT SUMMARY AND BIOMETRICS  Vitals Temp: 98.4 F (36.9 C) BP: 131/80 Pulse Rate: 64 SpO2: 98 %   Anthropometric Measurements Height: 5\' 5"  (1.651 m) Weight: 270 lb (122.5 kg) BMI (Calculated): 44.93 Weight at Last Visit: 269lb Weight Lost Since Last Visit: 0 Weight Gained Since Last Visit: 1lb Starting Weight: 291lb Total Weight Loss (lbs): 21 lb (9.526 kg)   Body Composition  Body Fat %: 53 % Fat Mass (lbs): 143.6 lbs Muscle Mass (lbs): 120.8 lbs Total Body Water (lbs): 93.6 lbs Visceral Fat Rating : 18   Other Clinical Data Fasting: y Labs: n Today's Visit #: 12 Starting Date: 02/20/21     HPI  Chief Complaint: OBESITY  Joann Mendez is here to discuss her progress with her obesity treatment plan. She is on the the Category 2 Plan and keeping a food journal and adhering to recommended goals of 1200 calories and 85 protein and states she is following her eating plan approximately 65 % of the time. 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   Interval History:  Since last office visit she up 1 lb. she went on a trip out of town. She charted a fishing boat and run in an air B&B for her girls and their boyfriends for several days as a thank you for helping with her husband's care after his cardiac surgery. Hunger/appetite-controlled overall, she is trying to eat every 2 hours either now or her appropriate snacks. Cravings-endorses cravings especially in the night, sometimes waking up hungry during the night and eating a snack Stress-reports improvement in stress levels overall, feels like her mood is improved and she is definitely feeling brighter and more encouraged.  She is actually taking vacation time.  A very good coworker who she had worked with for 33 years retired and this was difficult for her but she is working through it.  She is starting  to think about a game plan for her retirement Sleep-not always able to sleep through the night and she is waking up hungry and occasionally eating a snack during the night Exercise-no formal exercise and we talked about at least walking 10 or 15 minutes each evening after her evening meal to help with stress and also increase her activity level.  She does do farm work regularly with the horses. Hydration-she is working on improving her hydration and we discussed drinking at least 64 ounces of water daily.  She is very rarely ever drinking a regular Coke and this is a significant improvement for her.  We also discussed going back to weighing her foods to ensure that she is getting the right amounts of protein.  Pharmacotherapy: metformin for Prediabetes-she did not actually restart it yet as she was going on a trip out of town and was not sure how her stomach would tolerate it, so she just started it.  PHYSICAL EXAM:  Blood pressure 131/80, pulse 64, temperature 98.4 F (36.9 C), height 5\' 5"  (1.651 m), weight 270 lb (122.5 kg), SpO2 98 %. Body mass index is 44.93 kg/m.  General: She is overweight, cooperative, alert, well developed, and in no acute distress. PSYCH: Has normal mood, affect and thought process.   Cardiovascular: Heart rate 64, regular Lungs: Normal breathing effort, no conversational dyspnea. Neuro: No focal deficits  DIAGNOSTIC DATA REVIEWED:  BMET    Component Value Date/Time  NA 141 02/20/2021 0951   K 4.6 02/20/2021 0951   CL 101 02/20/2021 0951   CO2 23 02/20/2021 0951   GLUCOSE 102 (H) 02/20/2021 0951   GLUCOSE 121 (H) 02/16/2013 2310   BUN 15 02/20/2021 0951   CREATININE 0.85 02/20/2021 0951   CALCIUM 9.3 02/20/2021 0951   GFRNONAA 77 (L) 02/16/2013 2310   GFRAA 90 (L) 02/16/2013 2310   Lab Results  Component Value Date   HGBA1C 5.9 (H) 09/23/2021   HGBA1C 5.8 (H) 06/06/2019   Lab Results  Component Value Date   INSULIN 13.8 09/23/2021   INSULIN  18.1 02/20/2021   Lab Results  Component Value Date   TSH 2.240 02/20/2021   CBC    Component Value Date/Time   WBC 10.5 02/20/2021 0951   WBC 14.1 (H) 02/16/2013 2310   RBC 4.52 02/20/2021 0951   RBC 4.40 02/16/2013 2310   HGB 13.6 02/20/2021 0951   HCT 39.6 02/20/2021 0951   PLT 245 02/20/2021 0951   MCV 88 02/20/2021 0951   MCH 30.1 02/20/2021 0951   MCH 30.5 02/16/2013 2310   MCHC 34.3 02/20/2021 0951   MCHC 33.3 02/16/2013 2310   RDW 12.0 02/20/2021 0951   Iron Studies No results found for: "IRON", "TIBC", "FERRITIN", "IRONPCTSAT" Lipid Panel     Component Value Date/Time   CHOL 174 02/20/2021 0951   TRIG 82 02/20/2021 0951   HDL 59 02/20/2021 0951   LDLCALC 100 (H) 02/20/2021 0951   Hepatic Function Panel     Component Value Date/Time   PROT 6.8 02/20/2021 0951   ALBUMIN 4.5 02/20/2021 0951   AST 14 02/20/2021 0951   ALT 10 02/20/2021 0951   ALKPHOS 73 02/20/2021 0951   BILITOT 0.5 02/20/2021 0951      Component Value Date/Time   TSH 2.240 02/20/2021 0951   Nutritional Lab Results  Component Value Date   VD25OH 35.7 09/23/2021   VD25OH 23.3 (L) 02/20/2021    ASSOCIATED CONDITIONS ADDRESSED TODAY  ASSESSMENT AND PLAN Prediabetes Last A1c was 5.9/ Insulin 13.8  Medication(s):  metformin 250 mg daily- Patient reports just starting today. Will monitor for side effects and discussed making sure she eats with medication, avoids simple carbohydrates.  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   Lab Results  Component Value Date   HGBA1C 5.9 (H) 09/23/2021   HGBA1C 6.2 (H) 02/20/2021   HGBA1C 5.8 (H) 06/06/2019   Lab Results  Component Value Date   INSULIN 13.8 09/23/2021   INSULIN 18.1 02/20/2021    Plan: Continue  metformin 250 mg once daily.  Continue working on nutrition plan to decrease  simple carbohydrates, increase lean proteins and exercise to promote weight loss, improve glycemic control and prevent progression to Type 2 diabetes.     Vitamin D Deficiency Vitamin D is not at goal of 50.  Most recent vitamin D level was 35.7. She is on  prescription ergocalciferol 50,000 IU weekly. Lab Results  Component Value Date   VD25OH 35.7 09/23/2021   VD25OH 23.3 (L) 02/20/2021    Plan: Continue  prescription ergocalciferol 50,000 IU weekly Continue working on nutrition plan to decrease simple carbohydrates, increase lean proteins and exercise to promote weight loss, improve glycemic control and prevent progression to Type 2 diabetes.    Mood disorder/emotional eating Joann Mendez has had issues with stress/emotional eating. Currently this  is moderately controlled. Overall mood is stable. Medication(s): None  Plan:  She feels she is doing better with mood overall. No SI/HI/ other concerns.  Discussed strategies for emotional eating/stress eating. Discussed can refer if desires counseling. She feels much better now, but will keep it in mind.   Problem List Items Addressed This Visit     Class 3 severe obesity with serious comorbidity and body mass index (BMI) of 45.0 to 49.9 in adult [facility])   Prediabetes - Primary   Vitamin D deficiency   BMI 45.0-49.9, adult-WITH CURRENT BMI 44.8   Stress   Current BMI 45.1 Down 21 lbs overall TBW loss 7.2%  TREATMENT PLAN FOR OBESITY:  Recommended Dietary Goals  Joann Mendez is currently in the action stage of change. As such, her goal is to continue weight management plan. She has agreed to the Category 2 Plan and keeping a food journal and adhering to recommended goals of 1200 calories and 85+ grams of protein.  Behavioral Intervention  We discussed the following Behavioral Modification Strategies today: increasing lean protein intake, decreasing simple carbohydrates , increasing vegetables, increasing water intake, emotional eating  strategies and understanding the difference between hunger signals and cravings, work on managing stress, creating time for self-care and relaxation measures, continue to practice mindfulness when eating, and planning for success.  Additional resources provided today: NA  Recommended Physical Activity Goals  Kinsley has been advised to work up to 150 minutes of moderate intensity aerobic activity a week and strengthening exercises 2-3 times per week for cardiovascular health, weight loss maintenance and preservation of muscle mass.   She has agreed to Continue current level of physical activity  and Think about ways to increase physical activity   Pharmacotherapy We discussed various medication options to help Lummie with her weight loss efforts and we both agreed to start metformin for prediabetes (she did not start it yet, but plans to) and continue to work on nutritional and behavioral strategies to promote weight loss.     Return in about 3 weeks (around 06/23/2022).Marland Kitchen She was informed of the importance of frequent follow up visits to maximize her success with intensive lifestyle modifications for her multiple health conditions.   ATTESTASTION STATEMENTS:  Reviewed by clinician on day of visit: allergies, medications, problem list, medical history, surgical history, family history, social history, and previous encounter notes.   I have personally spent 45 minutes total time today in preparation, patient care, nutritional counseling and documentation for this visit, including the following: review of clinical lab tests; review of medical tests/procedures/services.      Joann Apsey, PA-C

## 2022-06-08 ENCOUNTER — Other Ambulatory Visit (INDEPENDENT_AMBULATORY_CARE_PROVIDER_SITE_OTHER): Payer: Self-pay | Admitting: Family Medicine

## 2022-06-08 DIAGNOSIS — R7303 Prediabetes: Secondary | ICD-10-CM

## 2022-06-16 ENCOUNTER — Ambulatory Visit (INDEPENDENT_AMBULATORY_CARE_PROVIDER_SITE_OTHER): Payer: Federal, State, Local not specified - PPO | Admitting: Family Medicine

## 2022-07-07 ENCOUNTER — Ambulatory Visit (INDEPENDENT_AMBULATORY_CARE_PROVIDER_SITE_OTHER): Payer: Federal, State, Local not specified - PPO | Admitting: Physician Assistant

## 2022-07-29 ENCOUNTER — Ambulatory Visit (INDEPENDENT_AMBULATORY_CARE_PROVIDER_SITE_OTHER): Payer: Federal, State, Local not specified - PPO | Admitting: Physician Assistant

## 2022-08-04 DIAGNOSIS — H26492 Other secondary cataract, left eye: Secondary | ICD-10-CM | POA: Diagnosis not present

## 2022-08-04 DIAGNOSIS — Z961 Presence of intraocular lens: Secondary | ICD-10-CM | POA: Diagnosis not present

## 2022-08-08 ENCOUNTER — Ambulatory Visit (INDEPENDENT_AMBULATORY_CARE_PROVIDER_SITE_OTHER): Payer: Federal, State, Local not specified - PPO | Admitting: Family Medicine

## 2022-08-08 ENCOUNTER — Encounter: Payer: Self-pay | Admitting: Family Medicine

## 2022-08-08 VITALS — BP 132/81 | HR 71 | Ht 66.5 in | Wt 276.0 lb

## 2022-08-08 DIAGNOSIS — Z01419 Encounter for gynecological examination (general) (routine) without abnormal findings: Secondary | ICD-10-CM | POA: Diagnosis not present

## 2022-08-08 DIAGNOSIS — Z1231 Encounter for screening mammogram for malignant neoplasm of breast: Secondary | ICD-10-CM | POA: Diagnosis not present

## 2022-08-08 DIAGNOSIS — Z1211 Encounter for screening for malignant neoplasm of colon: Secondary | ICD-10-CM

## 2022-08-08 NOTE — Progress Notes (Signed)
   GYNECOLOGY ANNUAL PREVENTATIVE CARE ENCOUNTER NOTE  Subjective:   Joann Mendez is a 62 y.o. G25P2002 female here for a routine annual gynecologic exam.  Current complaints: Had many life events this past year-- mother with Stroke and Cath, husband with bypass surgery. She is still working fulltime. Reports coworkers are retiring and this is challenging. She was working with [employer] and lost 20lbs but this year has regained.    Denies abnormal vaginal bleeding, discharge, pelvic pain, problems with intercourse or other gynecologic concerns.    Gynecologic History No LMP recorded. Patient has had a hysterectomy. Contraception: post menopausal status Last Pap: 2023. Results were: normal Last mammogram: 2023. Results were: normal  Health Maintenance Due  Topic Date Due   HIV Screening  Never done   Hepatitis C Screening  Never done   Zoster Vaccines- Shingrix (1 of 2) Never done   DTaP/Tdap/Td (2 - Tdap) 01/27/2002   Colonoscopy  Never done   COVID-19 Vaccine (3 - Pfizer risk series) 06/10/2019    The following portions of the patient's history were reviewed and updated as appropriate: allergies, current medications, past family history, past medical history, past social history, past surgical history and problem list.  Review of Systems Pertinent items are noted in HPI.   Objective:  BP 132/81   Pulse 71   Ht 5' 6.5" (1.689 m)   Wt 276 lb (125.2 kg)   BMI 43.88 kg/m  CONSTITUTIONAL: Well-developed, well-nourished female in no acute distress.  HENT:  Normocephalic, atraumatic, External right and left ear normal. Oropharynx is clear and moist EYES:  No scleral icterus.  NECK: Normal range of motion, supple, no masses.  Normal thyroid.  SKIN: Skin is warm and dry. No rash noted. Not diaphoretic. No erythema. No pallor. NEUROLOGIC: Alert and oriented to person, place, and time. Normal reflexes, muscle tone coordination. No cranial nerve deficit noted. PSYCHIATRIC:  Normal mood and affect. Normal behavior. Normal judgment and thought content. CARDIOVASCULAR: Normal heart rate noted, regular rhythm. 2+ distal pulses. RESPIRATORY: Effort and breath sounds normal, no problems with respiration noted. BREASTS: Symmetric in size. No masses, skin changes, nipple drainage, or lymphadenopathy. ABDOMEN: Soft,  no distention noted.  No tenderness, rebound or guarding.  PELVIC: Normal appearing external genitalia; Mildly atrophic vaginal mucosa and cervix. No abnormal discharge noted.   Normal uterine size, no other palpable masses, no uterine or adnexal tenderness. Chaperone present for exam MUSCULOSKELETAL: Normal range of motion.    Assessment and Plan:  1) Annual gynecologic examination without pap smear:   Routine preventative health maintenance measures emphasized. Reviewed perimenopausal symptoms and management. Discussed caring for herself.     1. Colon cancer screening - Cologuard  2. Well woman exam with routine gynecological exam - Cologuard - MM 3D SCREENING MAMMOGRAM BILATERAL BREAST; Future  3. Encounter for screening mammogram for malignant neoplasm of breast - MM 3D SCREENING MAMMOGRAM BILATERAL BREAST; Future   Please refer to After Visit Summary for other counseling recommendations.   No follow-ups on file.  Federico Flake, MD, MPH, ABFM Attending [facility] for [facility]

## 2022-08-08 NOTE — Progress Notes (Signed)
Patient presents for Annual.  LMP: Hysterectomy  Last pap: Date: 07/22/21 Contraception:  Hysterectomy  Mammogram: Due, last mammogram: 10/02/21 - wants a [facility]  STD Screening: not indicated Flu Vaccine : N/A  CC:  Annual/ denies any concerns

## 2022-08-19 ENCOUNTER — Ambulatory Visit (INDEPENDENT_AMBULATORY_CARE_PROVIDER_SITE_OTHER): Payer: Federal, State, Local not specified - PPO | Admitting: Physician Assistant

## 2022-09-01 ENCOUNTER — Ambulatory Visit (INDEPENDENT_AMBULATORY_CARE_PROVIDER_SITE_OTHER): Payer: Federal, State, Local not specified - PPO | Admitting: Physician Assistant

## 2022-09-04 ENCOUNTER — Other Ambulatory Visit (INDEPENDENT_AMBULATORY_CARE_PROVIDER_SITE_OTHER): Payer: Self-pay | Admitting: Physician Assistant

## 2022-09-04 DIAGNOSIS — R7303 Prediabetes: Secondary | ICD-10-CM

## 2022-09-21 NOTE — Progress Notes (Unsigned)
.smr  Office: [phone]  /  Fax: [phone]  WEIGHT SUMMARY AND BIOMETRICS  Vitals Temp: 97.8 F (36.6 C) BP: 138/75 Pulse Rate: 63 SpO2: 98 %   Anthropometric Measurements Height: 5\' 5"  (1.651 m) Weight: 274 lb (124.3 kg) BMI (Calculated): 45.6 Weight at Last Visit: 270 lb Weight Lost Since Last Visit: 0 Weight Gained Since Last Visit: 4 lb Starting Weight: 291 lb Total Weight Loss (lbs): 17 lb (7.711 kg) Peak Weight: 294 lb   Body Composition  Body Fat %: 53.4 % Fat Mass (lbs): 146.4 lbs Muscle Mass (lbs): 121.4 lbs Total Body Water (lbs): 94.4 lbs Visceral Fat Rating : 19   Other Clinical Data Fasting: yes Labs: no Today's Visit #: 13 Starting Date: 02/20/21     HPI  Chief Complaint: OBESITY  Rodas is here to discuss her progress with her obesity treatment plan. She is on the the Category 2 Plan and states she is following her eating plan approximately 0 % of the time. 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 Discussed the use of AI scribe software for clinical note transcription with the patient, who gave verbal consent to proceed.  History of Present Illness       The patient, with a history of prediabetes and vitamin D deficiency, presents for a follow-up of her obesity treatment. She reports increased stress due to her husband's recent heart surgery, her mother's health issues, and work-related stress. She has been trying to manage her stress and has noticed an increase in her cholesterol and glucose levels. She has also been experiencing sleep disturbances. She has not been taking her prescribed metformin and vitamin D supplements regularly. 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 She has set a retirement date and is trying to get her life in order for retirement with goal of 2026.  She had biometric labs done at her work place and brings in her lab  results.  She has noticed an increase in her cholesterol and glucose levels, which she attributes to her stress and diet. She has been experiencing sleep disturbances, which she believes are due to her stress. She has not been taking her prescribed metformin and vitamin D supplements regularly.    Interval History:  Last in office visit 06/02/22.  Since last office visit she is up 4 lbs Bio impedence scale reviewed with the patient:  Up 0.6 lbs muscle mass Up 2.8 lbs adipose mass Up 1.2 lbs total body water.   Cravings- notes increased comfort eating due to significant stress.   Pharmacotherapy: metformin for prediabetes. No GI upset or other side effects with metformin.   TREATMENT PLAN FOR OBESITY:  Recommended Dietary Goals  Calyse is currently in the action stage of change. As such, her goal is to continue weight management plan. She has agreed to the Category 2 Plan.  Behavioral Intervention  We discussed the following Behavioral Modification Strategies today: increasing lean protein intake, decreasing simple carbohydrates , increasing vegetables, increasing lower glycemic fruits, increasing water intake, work on meal planning and preparation, decreasing eating out or consumption of processed foods, and making healthy choices when eating convenient foods, emotional eating strategies and understanding the difference between hunger signals and cravings, work on managing stress, creating time for self-care and relaxation measures, continue to practice mindfulness when eating, planning for success, and staying on track while traveling and vacationing.  Additional  resources provided today: NA  Recommended Physical Activity Goals  Anelys has been advised to work up to 150 minutes of moderate intensity aerobic activity a week and strengthening exercises 2-3 times per week for cardiovascular health, weight loss maintenance and preservation of muscle mass.   She has agreed to Continue current  level of physical activity    Pharmacotherapy We discussed various medication options to help Arma with her weight loss efforts and we both agreed to continue metformin 500 mg daily for primary indication of prediabetes.    Return in about 5 weeks (around 10/27/2022).Marland Kitchen She was informed of the importance of frequent follow up visits to maximize her success with intensive lifestyle modifications for her multiple health conditions.  PHYSICAL EXAM:  Blood pressure 138/75, pulse 63, temperature 97.8 F (36.6 C), height 5\' 5"  (1.651 m), weight 274 lb (124.3 kg), SpO2 98%. Body mass index is 45.6 kg/m.  General: She is overweight, cooperative, alert, well developed, and in no acute distress. PSYCH: Has normal mood, affect and thought process.   Cardiovascular: HR 60's , BP 138/75 Lungs: Normal breathing effort, no conversational dyspnea.  DIAGNOSTIC DATA REVIEWED:  BMET    Component Value Date/Time   NA 141 02/20/2021 0951   K 4.6 02/20/2021 0951   CL 101 02/20/2021 0951   CO2 23 02/20/2021 0951   GLUCOSE 102 (H) 02/20/2021 0951   GLUCOSE 121 (H) 02/16/2013 2310   BUN 15 02/20/2021 0951   CREATININE 0.85 02/20/2021 0951   CALCIUM 9.3 02/20/2021 0951   GFRNONAA 77 (L) 02/16/2013 2310   GFRAA 90 (L) 02/16/2013 2310   Lab Results  Component Value Date   HGBA1C 5.9 (H) 09/23/2021   HGBA1C 5.8 (H) 06/06/2019   Lab Results  Component Value Date   INSULIN 13.8 09/23/2021   INSULIN 18.1 02/20/2021   Lab Results  Component Value Date   TSH 2.240 02/20/2021   CBC    Component Value Date/Time   WBC 10.5 02/20/2021 0951   WBC 14.1 (H) 02/16/2013 2310   RBC 4.52 02/20/2021 0951   RBC 4.40 02/16/2013 2310   HGB 13.6 02/20/2021 0951   HCT 39.6 02/20/2021 0951   PLT 245 02/20/2021 0951   MCV 88 02/20/2021 0951   MCH 30.1 02/20/2021 0951   MCH 30.5 02/16/2013 2310   MCHC 34.3 02/20/2021 0951   MCHC 33.3 02/16/2013 2310   RDW 12.0 02/20/2021 0951   Iron Studies No results  found for: "IRON", "TIBC", "FERRITIN", "IRONPCTSAT" Lipid Panel     Component Value Date/Time   CHOL 174 02/20/2021 0951   TRIG 82 02/20/2021 0951   HDL 59 02/20/2021 0951   LDLCALC 100 (H) 02/20/2021 0951   Hepatic Function Panel     Component Value Date/Time   PROT 6.8 02/20/2021 0951   ALBUMIN 4.5 02/20/2021 0951   AST 14 02/20/2021 0951   ALT 10 02/20/2021 0951   ALKPHOS 73 02/20/2021 0951   BILITOT 0.5 02/20/2021 0951      Component Value Date/Time   TSH 2.240 02/20/2021 0951   Nutritional Lab Results  Component Value Date   VD25OH 35.7 09/23/2021   VD25OH 23.3 (L) 02/20/2021    ASSOCIATED CONDITIONS ADDRESSED TODAY  ASSESSMENT AND PLAN  Problem List Items Addressed This Visit     Class 3 severe obesity with serious comorbidity and body mass index (BMI) of 45.0 to 49.9 in adult [facility])   Relevant Medications   metFORMIN (GLUCOPHAGE) 500 MG tablet   Prediabetes - Primary  Relevant Medications   metFORMIN (GLUCOPHAGE) 500 MG tablet   Vitamin D deficiency   Relevant Medications   Vitamin D, Ergocalciferol, (DRISDOL) 1.25 MG (50000 UNIT) CAPS capsule   B12 deficiency due to diet   Relevant Medications   cyanocobalamin (VITAMIN B12) 1000 MCG tablet   BMI 45.0-49.9, adult-WITH CURRENT BMI 44.8   Relevant Medications   metFORMIN (GLUCOPHAGE) 500 MG tablet   Stress   Pure hypercholesterolemia   Other Visit Diagnoses     Primary insomnia          Prediabetes Last A1c was 5.9  Medication(s): metformin  Polyphagia:No  Fasting glucose elevated at 112. Patient has not been taking prescribed Metformin consistently as numerous stressors, focusing on other's health has taken precedent. 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   Lab Results  Component Value Date   HGBA1C 5.9 (H) 09/23/2021   HGBA1C 6.2 (H)  02/20/2021   HGBA1C 5.8 (H) 06/06/2019   Lab Results  Component Value Date   INSULIN 13.8 09/23/2021   INSULIN 18.1 02/20/2021    Plan: Continue and refill Metformin 500 mg twice daily with meals -Resume Metformin, starting with half a tablet daily and increasing to a full tablet if tolerated. -Check A1C and insulin levels after on medication consistently for next 1-2 months.   Vitamin D Deficiency Vitamin D is not at goal of 50.  Most recent vitamin D level was 35.7. She is on  prescription ergocalciferol 50,000 IU twice weekly. No N/V or muscle weakness with vitamin D . Is needing refill.  Lab Results  Component Value Date   VD25OH 35.7 09/23/2021   VD25OH 23.3 (L) 02/20/2021    Plan: Continue and refill resume- prescription ergocalciferol 50,000 IU twice weekly Low vitamin D levels can be associated with adiposity and may result in leptin resistance and weight gain. Also associated with fatigue. Currently on vitamin D supplementation without any adverse effects.  Plan to recheck vitamin D level over next 1-2 months.   Stress: She reports increased stress due to her husband's recent heart surgery, her mother's health issues, and work-related stress. She has some strategies to deal with work related stressors and we discussed therapy may be helpful as well . Plan: Continue to work on strategies to decrease stress like getting adequate sleep and making sure she is getting adequate   Hyperlipidemia LDL is not at goal. Biometric testing done thru work place.  LABS Total Cholesterol: 198 mg/dL HDL: 65 mg/dL Triglycerides: 74 mg/dL Glucose: 409 mg/dL  LDL elevated> 811, likely due to diet and postmenopausal status. Discussed the importance of a healthy diet and the potential need for medication if lifestyle modifications do not improve LDL levels. -Encouraged to focus on diet, specifically reducing saturated fats and increasing intake of healthy fats. -Recheck cholesterol levels  after making dietary changes. Medication(s): None Cardiovascular risk factors: dyslipidemia and obesity (BMI >= 30 kg/m2)  Lab Results  Component Value Date   CHOL 174 02/20/2021   HDL 59 02/20/2021   LDLCALC 100 (H) 02/20/2021   TRIG 82 02/20/2021   Lab Results  Component Value Date   ALT 10 02/20/2021   AST 14 02/20/2021   ALKPHOS 73 02/20/2021   BILITOT 0.5 02/20/2021   The 10-year ASCVD risk score (Arnett DK, et al., 2019) is: 3.3%   Values used to calculate the score:  Age: 54 years     Sex: Female     Is Non-Hispanic African American: No     Diabetic: No     Tobacco smoker: No     Systolic Blood Pressure: 138 mmHg     Is BP treated: No     HDL Cholesterol: 59 mg/dL     Total Cholesterol: 174 mg/dL  Plan: Statin not indicated at this time.  Continue to work on nutrition plan -decreasing simple carbohydrates, increasing lean proteins, decreasing saturated fats and cholesterol , avoiding trans fats and exercise as able to promote weight loss, improve lipids and decrease cardiovascular risks. -Recheck cholesterol levels after making dietary changes.   Vitamin B12 deficiency Previous labs showed low Vitamin B12 levels. Patient has not been taking B12 supplementation. -Resume Vitamin B12 supplementation and recheck B 12 level over the next 1-2 months.   Insomnia Patient reports difficulty falling asleep and staying asleep, likely due to high stress levels. Discussed the importance of good sleep hygiene and the potential benefit of melatonin. -Try over-the-counter melatonin to improve sleep quality such as Natrol melatonin 5 mg for up to 1 week at a time. -Consider discussing chronic insomnia with primary care provider.   ATTESTASTION STATEMENTS:  Reviewed by clinician on day of visit: allergies, medications, problem list, medical history, surgical history, family history, social history, and previous encounter notes.   I have personally spent 48 minutes total time  today in preparation, patient care, nutritional counseling and documentation for this visit, including the following: review of clinical lab tests; review of medical tests/procedures/services.      Jessicca Stitzer, PA-C

## 2022-09-22 ENCOUNTER — Encounter (INDEPENDENT_AMBULATORY_CARE_PROVIDER_SITE_OTHER): Payer: Self-pay | Admitting: Physician Assistant

## 2022-09-22 ENCOUNTER — Ambulatory Visit (INDEPENDENT_AMBULATORY_CARE_PROVIDER_SITE_OTHER): Payer: Federal, State, Local not specified - PPO | Admitting: Physician Assistant

## 2022-09-22 VITALS — BP 138/75 | HR 63 | Temp 97.8°F | Ht 65.0 in | Wt 274.0 lb

## 2022-09-22 DIAGNOSIS — E7800 Pure hypercholesterolemia, unspecified: Secondary | ICD-10-CM | POA: Diagnosis not present

## 2022-09-22 DIAGNOSIS — E559 Vitamin D deficiency, unspecified: Secondary | ICD-10-CM | POA: Diagnosis not present

## 2022-09-22 DIAGNOSIS — F5101 Primary insomnia: Secondary | ICD-10-CM

## 2022-09-22 DIAGNOSIS — Z6841 Body Mass Index (BMI) 40.0 and over, adult: Secondary | ICD-10-CM

## 2022-09-22 DIAGNOSIS — R7303 Prediabetes: Secondary | ICD-10-CM

## 2022-09-22 DIAGNOSIS — F439 Reaction to severe stress, unspecified: Secondary | ICD-10-CM

## 2022-09-22 DIAGNOSIS — E538 Deficiency of other specified B group vitamins: Secondary | ICD-10-CM

## 2022-09-22 MED ORDER — METFORMIN HCL 500 MG PO TABS: ORAL_TABLET | ORAL | 0 refills | Status: DC

## 2022-09-22 MED ORDER — VITAMIN B-12 1000 MCG PO TABS: 1000.0000 ug | ORAL_TABLET | Freq: Every day | ORAL | Status: AC

## 2022-09-22 MED ORDER — VITAMIN D (ERGOCALCIFEROL) 1.25 MG (50000 UNIT) PO CAPS: ORAL_CAPSULE | ORAL | 0 refills | Status: DC

## 2022-10-06 DIAGNOSIS — Z1231 Encounter for screening mammogram for malignant neoplasm of breast: Secondary | ICD-10-CM | POA: Diagnosis not present

## 2022-10-06 LAB — HM MAMMOGRAPHY

## 2022-10-28 ENCOUNTER — Ambulatory Visit (INDEPENDENT_AMBULATORY_CARE_PROVIDER_SITE_OTHER): Payer: Federal, State, Local not specified - PPO | Admitting: Physician Assistant

## 2022-11-07 ENCOUNTER — Ambulatory Visit: Payer: Federal, State, Local not specified - PPO | Admitting: Family Medicine

## 2022-11-07 ENCOUNTER — Encounter: Payer: Self-pay | Admitting: Family Medicine

## 2022-11-07 VITALS — BP 136/74 | HR 65 | Temp 98.6°F | Ht 65.0 in | Wt 282.0 lb

## 2022-11-07 DIAGNOSIS — Z23 Encounter for immunization: Secondary | ICD-10-CM | POA: Diagnosis not present

## 2022-11-07 DIAGNOSIS — M25519 Pain in unspecified shoulder: Secondary | ICD-10-CM

## 2022-11-07 DIAGNOSIS — L989 Disorder of the skin and subcutaneous tissue, unspecified: Secondary | ICD-10-CM

## 2022-11-07 MED ORDER — TIZANIDINE HCL 2 MG PO TABS: 1.0000 mg | ORAL_TABLET | Freq: Every evening | ORAL | 0 refills | Status: DC | PRN

## 2022-11-07 NOTE — Patient Instructions (Addendum)
Likely muscle strain.  Try ice vs heat on your shoulder.  Ice may be more useful.   Keep swinging your arm in the meantime.   Use tizanidine at night.  Update me as needed.   It looks like you have bony changes that don't need intervention.

## 2022-11-07 NOTE — Progress Notes (Signed)
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  4 lesions, not in the skin but appear to be in the bone.  Not painful.  See exam.  Shoulder pain.  Cleaning stalls 2 days ago.  No fall. Was picking up a bucket.  Thought she pulled a muscle in the L shoulder.  Pain on range of motion.  Her mother had a CVA this year, patient went to [place] to care for her.  Husband had cardiac surgery.    She is going to f/u with weight clinic.  She had a troubling experience on vacation that was weight related.   Discussed.  Cologuard neg 2023 Mammogram done this year at [facility] 10/06/22  ASSESSMENT:  BI-RADS Category: 1-Mammo1Yr : Negative.  Mammogram due in 1 year.  Tdap done at work 2022 Defer HIV and HCV, low risk, d/w pt.   Meds, vitals, and allergies reviewed.  ROS: Per HPI unless specifically indicated in ROS section   Nad Ncat except for for small bony prominences noted on the scalp, anterior and superior.  The skin moves over the lesions.  No erythema or bruising.  No ulceration.  No overlying skin changes. Neck supple, no LA L upper trap sore locally.  She has pain on range of motion of the shoulder when testing the supraspinatus.  No arm drop.  No bruising.  Normal internal rotation.  Distally neurovascular intact with normal grip. Rrr Ctab  35 minutes were devoted to patient care in this encounter (this includes time spent reviewing the patient's file/history, interviewing and examining the patient, counseling/reviewing plan with patient).

## 2022-11-09 DIAGNOSIS — L989 Disorder of the skin and subcutaneous tissue, unspecified: Secondary | ICD-10-CM | POA: Insufficient documentation

## 2022-11-09 DIAGNOSIS — M25519 Pain in unspecified shoulder: Secondary | ICD-10-CM | POA: Insufficient documentation

## 2022-11-09 NOTE — Assessment & Plan Note (Addendum)
Likely muscle strain.  Try ice vs heat on your shoulder.  Ice may be more useful.   Keep swinging your arm in the meantime-discussed pendulum range of motion Use tizanidine at night.  Update me as needed.  Limit overhead lifting.

## 2022-11-09 NOTE — Assessment & Plan Note (Signed)
It looks like she has longstanding bony changes (and not changes in the scalp itself) that don't need intervention.  She does not have any neurologic symptoms.  Discussed.

## 2022-11-10 ENCOUNTER — Ambulatory Visit (INDEPENDENT_AMBULATORY_CARE_PROVIDER_SITE_OTHER): Payer: Federal, State, Local not specified - PPO | Admitting: Physician Assistant

## 2022-11-13 DIAGNOSIS — H3589 Other specified retinal disorders: Secondary | ICD-10-CM | POA: Diagnosis not present

## 2022-11-18 DIAGNOSIS — H3589 Other specified retinal disorders: Secondary | ICD-10-CM | POA: Diagnosis not present

## 2022-11-18 DIAGNOSIS — H219 Unspecified disorder of iris and ciliary body: Secondary | ICD-10-CM | POA: Diagnosis not present

## 2022-11-29 ENCOUNTER — Other Ambulatory Visit: Payer: Self-pay | Admitting: Family Medicine

## 2022-12-01 ENCOUNTER — Ambulatory Visit (INDEPENDENT_AMBULATORY_CARE_PROVIDER_SITE_OTHER): Payer: Federal, State, Local not specified - PPO | Admitting: Physician Assistant

## 2022-12-01 ENCOUNTER — Encounter (INDEPENDENT_AMBULATORY_CARE_PROVIDER_SITE_OTHER): Payer: Self-pay | Admitting: Physician Assistant

## 2022-12-01 VITALS — BP 136/77 | HR 56 | Temp 97.6°F | Ht 65.0 in | Wt 274.0 lb

## 2022-12-01 DIAGNOSIS — E538 Deficiency of other specified B group vitamins: Secondary | ICD-10-CM | POA: Diagnosis not present

## 2022-12-01 DIAGNOSIS — Z6841 Body Mass Index (BMI) 40.0 and over, adult: Secondary | ICD-10-CM

## 2022-12-01 DIAGNOSIS — E559 Vitamin D deficiency, unspecified: Secondary | ICD-10-CM

## 2022-12-01 DIAGNOSIS — R7303 Prediabetes: Secondary | ICD-10-CM

## 2022-12-01 DIAGNOSIS — E66813 Obesity, class 3: Secondary | ICD-10-CM

## 2022-12-01 MED ORDER — VITAMIN D (ERGOCALCIFEROL) 1.25 MG (50000 UNIT) PO CAPS: ORAL_CAPSULE | ORAL | 0 refills | Status: DC

## 2022-12-01 NOTE — Progress Notes (Signed)
.smr  Office: [phone]  /  Fax: [phone]  WEIGHT SUMMARY AND BIOMETRICS  Vitals Temp: 97.6 F (36.4 C) BP: 136/77 Pulse Rate: (!) 56 SpO2: 100 %   Anthropometric Measurements Height: 5\' 5"  (1.651 m) Weight: 274 lb (124.3 kg) BMI (Calculated): 45.6 Weight at Last Visit: 274 lb Weight Lost Since Last Visit: 1 lb Weight Gained Since Last Visit: 0 Starting Weight: 291 lb Total Weight Loss (lbs): 18 lb (8.165 kg) Peak Weight: 294 lb   Body Composition  Body Fat %: 53.5 % Fat Mass (lbs): 146.2 lbs Muscle Mass (lbs): 120.8 lbs Total Body Water (lbs): 94.8 lbs Visceral Fat Rating : 19   Other Clinical Data Fasting: no Labs: no Today's Visit #: 14 Starting Date: 02/20/21     HPI  Chief Complaint: OBESITY  Joann Mendez is here to discuss her progress with her obesity treatment plan. She is on the the Category 2 Plan and states she is following her eating plan approximately 75-80 % of the time. She states she is exercising 0 minutes 0 times per week.  Discussed the use of AI scribe software for clinical note transcription with the patient, who gave verbal consent to proceed.  History of Present Illness  /   Interval History:  Since last office visit she is down 1 lb.  She has been struggling with multiple issues over the past few months.  Last in office visit was 09/22/22.  She is wanting to get back on track with her nutrition plan and has lost weight over the past few weeks by concentrating on getting adequate protein and adhering to plan more closely.   The patient, with a history of obesity, prediabetes, vitamin D deficiency, and vitamin B12 deficiency, presents for a follow-up of her obesity treatment plan. She reports multiple stressors in her life including her parents' declining health, her own health issues, and relationship issues with her husband. She has been experiencing shoulder pain, for which she has been taking Tylenol and avoiding muscle relaxants. She  also reports a history of eye problems including cataract surgeries, lens replacements, and retinal detachments. Recently, she was referred to an ophthalmology oncologist due to an abnormal finding on an eye ultrasound, which has added to her stress.  The patient also discusses her struggles with her weight. She reports that she had previously lost weight, getting down to 261 lbs, but has since gained back some of the weight, reaching a high of 280 lbs. She has been making efforts to get back on track with her diet and exercise, including helping her daughter with riding lessons as a form of physical activity. She acknowledges the importance of managing her weight for her overall health and expresses a desire to improve her health for her retirement.  The patient also discusses relationship issues with her husband, feeling like she is living as an individual rather than as a couple. She reports feeling overwhelmed with taking care of all his needs while also managing her own health and work. She expresses a desire for more support and understanding from her husband.  Pharmacotherapy: metformin for prediabetes. No GI upset or other side effects with metformin. She has not been taking and is getting back on track with medications.   TREATMENT PLAN FOR OBESITY: Obesity Patient has been struggling with weight loss due to emotional stress and inconsistent diet. She has made some progress, losing weight from 282 to 274 lbs. Over the past few weeks.  Discussed the importance of consistent meal  planning, hydration, and finding non-food related stress relievers. She has been helping her daughter with horse riding lessons.  -Continue with current nutrition plan and increase water intake. -Encouraged to use meal planning services like [employer] meal delivery for convenience and consistency. -Return for follow-up in one month. Recommended Dietary Goals  [name] is currently in the action stage of change. As such, her  goal is to continue weight management plan. She has agreed to the Category 1 Plan.  Behavioral Intervention  We discussed the following Behavioral Modification Strategies today: continue to work on maintaining a reduced calorie state, getting the recommended amount of protein, incorporating whole foods, making healthy choices, staying well hydrated and practicing mindfulness when eating..  Additional resources provided today: NA  Recommended Physical Activity Goals  [name] has been advised to work up to 150 minutes of moderate intensity aerobic activity a week and strengthening exercises 2-3 times per week for cardiovascular health, weight loss maintenance and preservation of muscle mass.   She has agreed to Think about enjoyable ways to increase daily physical activity and overcoming barriers to exercise and Increase physical activity in their day and reduce sedentary time (increase NEAT).   Pharmacotherapy We discussed various medication options to help Amali with her weight loss efforts and we both agreed to continue metformin 250 mg daily for prediabetes.    Return in about 4 weeks (around 12/29/2022).Marland Kitchen She was informed of the importance of frequent follow up visits to maximize her success with intensive lifestyle modifications for her multiple health conditions.  PHYSICAL EXAM:  Blood pressure 136/77, pulse (!) 56, temperature 97.6 F (36.4 C), height 5\' 5"  (1.651 m), weight 274 lb (124.3 kg), SpO2 100%. Body mass index is 45.6 kg/m.  General: She is overweight, cooperative, alert, well developed, and in no acute distress. PSYCH: Has normal mood, affect and thought process.   Cardiovascular: HR 50's BP 136/77 Lungs: Normal breathing effort, no conversational dyspnea. Neuro: no focal deficits  DIAGNOSTIC DATA REVIEWED:  BMET    Component Value Date/Time   NA 141 02/20/2021 0951   K 4.6 02/20/2021 0951   CL 101 02/20/2021 0951   CO2 23 02/20/2021 0951   GLUCOSE 102 (H)  02/20/2021 0951   GLUCOSE 121 (H) 02/16/2013 2310   BUN 15 02/20/2021 0951   CREATININE 0.85 02/20/2021 0951   CALCIUM 9.3 02/20/2021 0951   GFRNONAA 77 (L) 02/16/2013 2310   GFRAA 90 (L) 02/16/2013 2310   Lab Results  Component Value Date   HGBA1C 5.9 (H) 09/23/2021   HGBA1C 5.8 (H) 06/06/2019   Lab Results  Component Value Date   INSULIN 13.8 09/23/2021   INSULIN 18.1 02/20/2021   Lab Results  Component Value Date   TSH 2.240 02/20/2021   CBC    Component Value Date/Time   WBC 10.5 02/20/2021 0951   WBC 14.1 (H) 02/16/2013 2310   RBC 4.52 02/20/2021 0951   RBC 4.40 02/16/2013 2310   HGB 13.6 02/20/2021 0951   HCT 39.6 02/20/2021 0951   PLT 245 02/20/2021 0951   MCV 88 02/20/2021 0951   MCH 30.1 02/20/2021 0951   MCH 30.5 02/16/2013 2310   MCHC 34.3 02/20/2021 0951   MCHC 33.3 02/16/2013 2310   RDW 12.0 02/20/2021 0951   Iron Studies No results found for: "IRON", "TIBC", "FERRITIN", "IRONPCTSAT" Lipid Panel     Component Value Date/Time   CHOL 174 02/20/2021 0951   TRIG 82 02/20/2021 0951   HDL 59 02/20/2021 0951   LDLCALC  100 (H) 02/20/2021 0951   Hepatic Function Panel     Component Value Date/Time   PROT 6.8 02/20/2021 0951   ALBUMIN 4.5 02/20/2021 0951   AST 14 02/20/2021 0951   ALT 10 02/20/2021 0951   ALKPHOS 73 02/20/2021 0951   BILITOT 0.5 02/20/2021 0951      Component Value Date/Time   TSH 2.240 02/20/2021 0951   Nutritional Lab Results  Component Value Date   VD25OH 35.7 09/23/2021   VD25OH 23.3 (L) 02/20/2021    ASSOCIATED CONDITIONS ADDRESSED TODAY  ASSESSMENT AND PLAN  Problem List Items Addressed This Visit     Class 3 severe obesity with serious comorbidity and body mass index (BMI) of 45.0 to 49.9 in adult [facility])   Prediabetes - Primary   Vitamin D deficiency   Relevant Medications   Vitamin D, Ergocalciferol, (DRISDOL) 1.25 MG (50000 UNIT) CAPS capsule   B12 deficiency due to diet   BMI 45.0-49.9, adult-WITH CURRENT  BMI 44.8    Prediabetes Patient has been inconsistent with Metformin use. Discussed the importance of regular medication for managing prediabetes. -Resume Metformin half a tablet daily-250 mg daily. She does not need refill currently. -Encouraged to maintain a consistent medication schedule. Continue working on nutrition plan to decrease simple carbohydrates, increase lean proteins and exercise to promote weight loss, improve glycemic control and prevent progression to Type 2 diabetes.    Vitamin D deficiency Patient is currently on Vitamin D supplements Ergocalciferol 50,000 units twice a week. Low vitamin D levels can be associated with adiposity and may result in leptin resistance and weight gain. Also associated with fatigue. Currently on vitamin D supplementation without any adverse effects.   -Refill prescription for Ergocalciferol 50,000 units twice weekly.   Vitamin B12 deficiency Patient is currently on over-the-counter Vitamin B12 500 mcg daily supplements. No side effects.  -Continue over-the-counter Vitamin B12 supplements.  Eye Health Patient has a history of cataract surgeries, lens replacements, and retinal detachments. Recent ultrasound of the eye showed no dislodged lens but revealed an unidentified issue. Patient has been referred to an ophthalmology oncologist for further evaluation. -Continue follow-up with ophthalmology oncologist as directed.  General Health Maintenance  -Schedule follow-up appointment for December 30, 2022.  ATTESTASTION STATEMENTS:  Reviewed by clinician on day of visit: allergies, medications, problem list, medical history, surgical history, family history, social history, and previous encounter notes.   I have personally spent 50 minutes total time today in preparation, patient care, nutritional counseling and documentation for this visit, including the following: review of clinical lab tests; review of medical tests/procedures/services.       Othello Sgroi, PA-C

## 2022-12-30 ENCOUNTER — Ambulatory Visit (INDEPENDENT_AMBULATORY_CARE_PROVIDER_SITE_OTHER): Payer: Federal, State, Local not specified - PPO | Admitting: Physician Assistant

## 2023-01-12 DIAGNOSIS — L308 Other specified dermatitis: Secondary | ICD-10-CM | POA: Diagnosis not present

## 2023-01-12 DIAGNOSIS — H35371 Puckering of macula, right eye: Secondary | ICD-10-CM | POA: Diagnosis not present

## 2023-01-12 DIAGNOSIS — L718 Other rosacea: Secondary | ICD-10-CM | POA: Diagnosis not present

## 2023-01-12 DIAGNOSIS — L7211 Pilar cyst: Secondary | ICD-10-CM | POA: Diagnosis not present

## 2023-01-12 DIAGNOSIS — H35352 Cystoid macular degeneration, left eye: Secondary | ICD-10-CM | POA: Diagnosis not present

## 2023-01-12 DIAGNOSIS — H33003 Unspecified retinal detachment with retinal break, bilateral: Secondary | ICD-10-CM | POA: Diagnosis not present

## 2023-01-26 ENCOUNTER — Ambulatory Visit (INDEPENDENT_AMBULATORY_CARE_PROVIDER_SITE_OTHER): Payer: Federal, State, Local not specified - PPO | Admitting: Physician Assistant

## 2023-03-10 ENCOUNTER — Ambulatory Visit (INDEPENDENT_AMBULATORY_CARE_PROVIDER_SITE_OTHER): Payer: Federal, State, Local not specified - PPO | Admitting: Physician Assistant

## 2023-05-26 ENCOUNTER — Encounter (INDEPENDENT_AMBULATORY_CARE_PROVIDER_SITE_OTHER): Payer: Self-pay | Admitting: Physician Assistant

## 2023-05-26 ENCOUNTER — Ambulatory Visit (INDEPENDENT_AMBULATORY_CARE_PROVIDER_SITE_OTHER): Admitting: Physician Assistant

## 2023-05-26 VITALS — BP 139/82 | HR 71 | Temp 97.9°F | Ht 65.0 in | Wt 280.0 lb

## 2023-05-26 DIAGNOSIS — E538 Deficiency of other specified B group vitamins: Secondary | ICD-10-CM

## 2023-05-26 DIAGNOSIS — E559 Vitamin D deficiency, unspecified: Secondary | ICD-10-CM

## 2023-05-26 DIAGNOSIS — R7303 Prediabetes: Secondary | ICD-10-CM

## 2023-05-26 DIAGNOSIS — D1801 Hemangioma of skin and subcutaneous tissue: Secondary | ICD-10-CM | POA: Diagnosis not present

## 2023-05-26 DIAGNOSIS — F439 Reaction to severe stress, unspecified: Secondary | ICD-10-CM | POA: Diagnosis not present

## 2023-05-26 DIAGNOSIS — L304 Erythema intertrigo: Secondary | ICD-10-CM | POA: Diagnosis not present

## 2023-05-26 DIAGNOSIS — E66813 Obesity, class 3: Secondary | ICD-10-CM

## 2023-05-26 DIAGNOSIS — F5089 Other specified eating disorder: Secondary | ICD-10-CM

## 2023-05-26 DIAGNOSIS — Z6841 Body Mass Index (BMI) 40.0 and over, adult: Secondary | ICD-10-CM

## 2023-05-26 DIAGNOSIS — D169 Benign neoplasm of bone and articular cartilage, unspecified: Secondary | ICD-10-CM | POA: Diagnosis not present

## 2023-05-26 MED ORDER — VITAMIN D (ERGOCALCIFEROL) 1.25 MG (50000 UNIT) PO CAPS: ORAL_CAPSULE | ORAL | 0 refills | Status: AC

## 2023-05-26 NOTE — Progress Notes (Signed)
 SUBJECTIVE: Discussed the use of AI scribe software for clinical note transcription with the patient, who gave verbal consent to proceed.  Chief Complaint: Obesity  Interim History: Returns to [facility] after last OV 12/01/23.   She is up 6 lbs since last visit.   Joann Mendez is here to discuss her progress with her obesity treatment plan. She is on the Category 2 Plan and states she is not following her eating plan approximately 0 % of the time. 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  Joann Mendez is a 63 year old female who presents for follow-up on her obesity treatment plan.  She has not been following her obesity treatment plan for nearly five months and is currently not taking any medications. She is experiencing poor sleep and significant stress related to her work situation, including the possibility of early retirement or deferred resignation from her position at the [employer]. She is concerned about potential changes to her benefits and plans to retire by the end of May to protect her earned benefits. 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  She reports family stressors, including her father's upcoming pacemaker and defibrillator surgery and her sister's significant other being in the ICU. Her husband's career struggles and her daughter's job difficulties are also contributing to her overall stress.  She is not currently taking metformin , which she was previously prescribed, and has not been taking her vitamin D  or daily baby aspirin regularly. She experiences nocturnal muscle cramps, particularly at night, and acknowledges that her water intake may be insufficient. She is not following any structured exercise routine but engages in physical activity through daily chores such as cleaning stalls and filling water tanks.  Recent medical biometric monitoring through work at the [employer] showed good cholesterol levels and  normal glucose, but no A1c was done. She plans to focus on her health, including getting back on her obesity treatment plan and improving her exercise routine.  Pharmacotherapy: metformin  for prediabetes- She has a supply- Has not been taking.   Fasting Labs next OV with Napoleon Backer, NP.   OBJECTIVE: Visit Diagnoses: Problem List Items Addressed This Visit     Class 3 severe obesity with serious comorbidity and body mass index (BMI) of 45.0 to 49.9 in adult [facility])   Prediabetes - Primary   Vitamin D  deficiency   Relevant Medications   Vitamin D , Ergocalciferol , (DRISDOL ) 1.25 MG (50000 UNIT) CAPS capsule   B12 deficiency due to diet   Stress  Obesity Obesity management was previously deferred due to stressors related to impending retirement, family health issues, and personal stress. She is motivated to resume treatment. Engages in physical activity such as cleaning stalls and filling water tanks for an hour daily but requires more structured exercise. Emphasized protein intake for satiety and hunger control. - Resume structured exercise regimen, including walking or using the elliptical. - Focus on protein intake in each meal to aid in satiety and hunger control. - Encourage de-stressing activities such as listening to music while exercising.  Prediabetes/Insulin  resistance Management of prediabetes and insulin  resistance was previously interrupted. She was on metformin  but has not been taking it. Metformin  is recommended to address insulin  resistance and potentially elevated A1c levels. Vitamin D  and B12 supplementation are advised to improve energy levels. Reports muscle cramps, likely due to inadequate hydration and electrolyte imbalance. Lab Results  Component Value Date   HGBA1C 5.9 (H) 09/23/2021  HGBA1C 6.2 (H) 02/20/2021   HGBA1C 5.8 (H) 06/06/2019   Lab Results  Component Value Date   LDLCALC 100 (H) 02/20/2021   CREATININE 0.85 02/20/2021   INSULIN   Date Value Ref  Range Status  09/23/2021 13.8 2.6 - 24.9 uIU/mL Final  ] - Restart metformin  at half a tablet (250 mg ) with food to minimize gastrointestinal side effects. - Initiate vitamin D  supplementation. - Initiate vitamin B12 supplementation, 1000 mcg sublingually daily. - Increase water intake to prevent dehydration and muscle cramps. - Try coconut water for electrolyte replenishment, especially if experiencing muscle cramps. Meds ordered this encounter  Medications   Vitamin D , Ergocalciferol , (DRISDOL ) 1.25 MG (50000 UNIT) CAPS capsule    Sig: 1 po q Thursday and 1 po q Sun    Dispense:  8 capsule    Refill:  0     Vitamin D  Deficiency Vitamin D  is not at goal of 50.  Most recent vitamin D  level was 35.7. She is on no vitamin D  currently. Lab Results  Component Value Date   VD25OH 35.7 09/23/2021   VD25OH 23.3 (L) 02/20/2021   Plan: Start  prescription ergocalciferol  50,000 IU weekly Low vitamin D  levels can be associated with adiposity and may result in leptin resistance and weight gain. Also associated with fatigue.  Previously on vitamin D  supplementation without any adverse effects such as nausea, vomiting or muscle weakness.  Meds ordered this encounter  Medications   Vitamin D , Ergocalciferol , (DRISDOL ) 1.25 MG (50000 UNIT) CAPS capsule    Sig: 1 po q Thursday and 1 po q Sun    Dispense:  8 capsule    Refill:  0   Plan to recheck labs next OV  Low B 12 levels Endorses fatigue. Poor sleep.  Lab Results  Component Value Date   VITAMINB12 498 09/23/2021   Plan: Resume B 12 1000 mcg daily and recheck labs next visit and adjust supplement accordingly.   Eating disorder/emotional eating Joann Mendez has had issues with stress/emotional eating. Currently this is poorly controlled. Overall mood is stable. Medication(s): None Plan:  She is going to work on getting back on track with her nutrition plan.  We discussed thinking of some strategies that might help her stay on track and  limit emotional eating . She may benefit from referral to Dr. Delaine Favorite.    Vitals Temp: 97.9 F (36.6 C) BP: 139/82 Pulse Rate: 71 SpO2: 98 %   Anthropometric Measurements Height: 5\' 5"  (1.651 m) Weight: 280 lb (127 kg) BMI (Calculated): 46.59 Weight at Last Visit: 274 lb Weight Lost Since Last Visit: 0 Weight Gained Since Last Visit: 6 lb Starting Weight: 291 lb Total Weight Loss (lbs): 11 lb (4.99 kg) Peak Weight: 294 lb   Body Composition  Body Fat %: 54.7 % Fat Mass (lbs): 153.2 lbs Muscle Mass (lbs): 120.6 lbs Visceral Fat Rating : 20   Other Clinical Data Fasting: no Labs: no Today's Visit #: 15 Starting Date: 02/20/21     ASSESSMENT AND PLAN:  Diet: Joann Mendez is currently in the action stage of change. As such, her goal is to get back to weight loss efforts. She has agreed to Category 2 Plan.  Exercise: Joann Mendez has been instructed to work up to a goal of 150 minutes of combined cardio and strengthening exercise per week and that some exercise is better than none for weight loss and overall health benefits.   Behavior Modification:  We discussed the following Behavioral Modification Strategies today: increasing  lean protein intake, decreasing simple carbohydrates, increasing vegetables, increase H2O intake, increase high fiber foods, no skipping meals, meal planning and cooking strategies, avoiding temptations, and planning for success. We discussed various medication options to help Joann Mendez with her weight loss efforts and we both agreed to resume previous Category 2 nutrition plan and metformin , vitamin supplementation and resume working on nutritional and behavioral strategies to promote weight loss.  .  Return in about 3 weeks (around 06/16/2023) for Fasting Lab.Aaron Aas She was informed of the importance of frequent follow up visits to maximize her success with intensive lifestyle modifications for her multiple health conditions.  Attestation Statements:   Reviewed by  clinician on day of visit: allergies, medications, problem list, medical history, surgical history, family history, social history, and previous encounter notes.   Time spent on visit including pre-visit chart review and post-visit care and charting was 35 minutes.    Melvin Marmo, PA-C

## 2023-06-16 ENCOUNTER — Ambulatory Visit (INDEPENDENT_AMBULATORY_CARE_PROVIDER_SITE_OTHER): Admitting: Adult Health

## 2023-06-16 ENCOUNTER — Encounter (INDEPENDENT_AMBULATORY_CARE_PROVIDER_SITE_OTHER): Payer: Self-pay | Admitting: Adult Health

## 2023-06-16 VITALS — BP 151/78 | HR 61 | Temp 97.5°F | Ht 65.0 in | Wt 279.0 lb

## 2023-06-16 DIAGNOSIS — E559 Vitamin D deficiency, unspecified: Secondary | ICD-10-CM | POA: Diagnosis not present

## 2023-06-16 DIAGNOSIS — Z6841 Body Mass Index (BMI) 40.0 and over, adult: Secondary | ICD-10-CM

## 2023-06-16 DIAGNOSIS — R7303 Prediabetes: Secondary | ICD-10-CM

## 2023-06-16 DIAGNOSIS — E538 Deficiency of other specified B group vitamins: Secondary | ICD-10-CM

## 2023-06-16 DIAGNOSIS — F439 Reaction to severe stress, unspecified: Secondary | ICD-10-CM | POA: Diagnosis not present

## 2023-06-16 DIAGNOSIS — E669 Obesity, unspecified: Secondary | ICD-10-CM

## 2023-06-16 DIAGNOSIS — E66813 Obesity, class 3: Secondary | ICD-10-CM

## 2023-06-16 MED ORDER — METFORMIN HCL 500 MG PO TABS: ORAL_TABLET | ORAL | 0 refills | Status: AC

## 2023-06-16 NOTE — Progress Notes (Signed)
 WEIGHT SUMMARY AND BIOMETRICS  Vitals Temp: (!) 97.5 F (36.4 C) BP: (!) 151/78 Pulse Rate: 61 SpO2: 97 %   Anthropometric Measurements Height: 5\' 5"  (1.651 m) Weight: 279 lb (126.6 kg) BMI (Calculated): 46.43 Weight at Last Visit: 280lb Weight Lost Since Last Visit: 1lb Weight Gained Since Last Visit: 0 Starting Weight: 291lb Total Weight Loss (lbs): 12 lb (5.443 kg) Peak Weight: 294lb   Body Composition  Body Fat %: 54.3 % Fat Mass (lbs): 151.6 lbs Muscle Mass (lbs): 121.2 lbs Total Body Water (lbs): 96.4 lbs Visceral Fat Rating : 20   Other Clinical Data Fasting: yes Labs: yes Today's Visit #: 16 Starting Date: 02/20/21    Chief Complaint:   OBESITY Joann Mendez is here to discuss her progress with her obesity treatment plan.  She is on the the Category 2 Plan and states she is following her eating plan approximately 30 % of the time.  She states she is exercising: NEAT Actitivities  Interim History:  BP elevated at OV She endorses increased stress at work and at home She is not antihypertensive therapy She denies acute cardiac sx's at present  Stress-  She provided the following life events: She is retiring from [employer], 18 months earlier than expected due to current [employer] federal draw backs Her parents are in ailing health, mother is age 70, father is age 22 Parents live near her only other sibling outside [place] Her mother will undergo cardiac valve replacement and Ms. Brickey will travel to her parents to offer assistance. 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 Her husband works FT  They each focus on "their own chores" on the farm. Ms. Toppins has two adult daughters that often request her help with ADLs  She feels over extended and fears that once she is retired- her family will request more assistance.  Exercise-Running an operational farm  Subjective:   1. Prediabetes Lab  Results  Component Value Date   HGBA1C 5.9 (H) 09/23/2021   HGBA1C 6.2 (H) 02/20/2021   HGBA1C 5.8 (H) 06/06/2019    She has not been taking Metformin  500  for >   2. Vitamin D  deficiency  Latest Reference Range & Units 02/20/21 09:51 09/23/21 08:26  Vitamin D , 25-Hydroxy 30.0 - 100.0 ng/mL 23.3 (L) 35.7  (L): Data is abnormally low  She takes Ergocalciferol  inconsistently  3. Stress She provided the following life events: She is retiring from [employer], 18 months earlier than expected due to current [employer] federal draw backs Her parents are in ailing health, mother is age 83, father is age 10 Parents live near her only other sibling outside [place] Her mother will undergo cardiac valve replacement and Ms. Niehoff will travel to her parents to offer assistance. 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 Her husband works FT  They each focus on "their own chores" on the farm. Ms. Schooley has two adult daughters that often request her help with ADLs  She feels over extended and fears that once she is retired- her family will request more assistance.  4. B12 deficiency due to diet  Latest Reference Range & Units 02/20/21 09:51 09/23/21 08:26  Vitamin B12 232 - 1,245 pg/mL 130 (L) 498  (L): Data is abnormally low  She is taking oral B12 1,000 mcg daily  Assessment/Plan:   1. Prediabetes (Primary) Check Labs - Comprehensive metabolic  panel with GFR - Hemoglobin A1c - Insulin , random Start metFORMIN  (GLUCOPHAGE ) 500 MG tablet 1/2 tab daily with meal Dispense: 30 tablet, Refills: 0 ordered   2. Vitamin D  deficiency Check Labs - VITAMIN D  25 Hydroxy (Vit-D Deficiency, Fractures)  3. Stress Remain active daily Offered referral to Psychology/therapist today- pt declined  4. B12 deficiency due to diet Check Labs - Vitamin B12  5. BMI 45.0-49.9, adult (HCC) Current BMI 46.43  Autym is currently in the action stage of  change. As such, her goal is to continue with weight loss efforts. She has agreed to the Category 2 Plan.   Exercise goals: All adults should avoid inactivity. Some physical activity is better than none, and adults who participate in any amount of physical activity gain some health benefits. Adults should also include muscle-strengthening activities that involve all major muscle groups on 2 or more days a week. Running [employer] modification strategies: increasing lean protein intake, decreasing simple carbohydrates, increasing vegetables, increasing water intake, no skipping meals, meal planning and cooking strategies, keeping healthy foods in the home, planning for success, and decreasing junk food.  Forestine has agreed to follow-up with our clinic in 4 weeks. She was informed of the importance of frequent follow-up visits to maximize her success with intensive lifestyle modifications for her multiple health conditions.   Mliss was informed we would discuss her lab results at her next visit unless there is a critical issue that needs to be addressed sooner. Caria agreed to keep her next visit at the agreed upon time to discuss these results.  Objective:   Blood pressure (!) 151/78, pulse 61, temperature (!) 97.5 F (36.4 C), height 5\' 5"  (1.651 m), weight 279 lb (126.6 kg), SpO2 97%. Body mass index is 46.43 kg/m.  General: Cooperative, alert, well developed, in no acute distress. HEENT: Conjunctivae and lids unremarkable. Cardiovascular: Regular rhythm.  Lungs: Normal work of breathing. Neurologic: No focal deficits.   Lab Results  Component Value Date   CREATININE 0.85 02/20/2021   BUN 15 02/20/2021   NA 141 02/20/2021   K 4.6 02/20/2021   CL 101 02/20/2021   CO2 23 02/20/2021   Lab Results  Component Value Date   ALT 10 02/20/2021   AST 14 02/20/2021   ALKPHOS 73 02/20/2021   BILITOT 0.5 02/20/2021   Lab Results  Component Value Date   HGBA1C 5.9 (H)  09/23/2021   HGBA1C 6.2 (H) 02/20/2021   HGBA1C 5.8 (H) 06/06/2019   Lab Results  Component Value Date   INSULIN  13.8 09/23/2021   INSULIN  18.1 02/20/2021   Lab Results  Component Value Date   TSH 2.240 02/20/2021   Lab Results  Component Value Date   CHOL 174 02/20/2021   HDL 59 02/20/2021   LDLCALC 100 (H) 02/20/2021   TRIG 82 02/20/2021   Lab Results  Component Value Date   VD25OH 35.7 09/23/2021   VD25OH 23.3 (L) 02/20/2021   Lab Results  Component Value Date   WBC 10.5 02/20/2021   HGB 13.6 02/20/2021   HCT 39.6 02/20/2021   MCV 88 02/20/2021   PLT 245 02/20/2021   No results found for: "IRON", "TIBC", "FERRITIN"  Attestation Statements:   Reviewed by clinician on day of visit: allergies, medications, problem list, medical history, surgical history, family history, social history, and previous encounter notes.  I have reviewed the above documentation for accuracy and completeness, and I agree with the above. -  Dicy Smigel d. Jaelynne Hockley, NP-C

## 2023-06-17 LAB — COMPREHENSIVE METABOLIC PANEL WITH GFR
ALT: 12 IU/L (ref 0–32)
AST: 15 IU/L (ref 0–40)
Albumin: 4.3 g/dL (ref 3.9–4.9)
Alkaline Phosphatase: 73 IU/L (ref 44–121)
BUN/Creatinine Ratio: 12 (ref 12–28)
BUN: 11 mg/dL (ref 8–27)
Bilirubin Total: 0.5 mg/dL (ref 0.0–1.2)
CO2: 23 mmol/L (ref 20–29)
Calcium: 9.3 mg/dL (ref 8.7–10.3)
Chloride: 106 mmol/L (ref 96–106)
Creatinine, Ser: 0.9 mg/dL (ref 0.57–1.00)
Globulin, Total: 2.3 g/dL (ref 1.5–4.5)
Glucose: 107 mg/dL — ABNORMAL HIGH (ref 70–99)
Potassium: 4.2 mmol/L (ref 3.5–5.2)
Sodium: 145 mmol/L — ABNORMAL HIGH (ref 134–144)
Total Protein: 6.6 g/dL (ref 6.0–8.5)
eGFR: 73 mL/min/{1.73_m2} (ref >59)

## 2023-06-17 LAB — VITAMIN D 25 HYDROXY (VIT D DEFICIENCY, FRACTURES): Vit D, 25-Hydroxy: 27.5 ng/mL — ABNORMAL LOW (ref 30.0–100.0)

## 2023-06-17 LAB — VITAMIN B12: Vitamin B-12: 424 pg/mL (ref 232–1245)

## 2023-06-17 LAB — HEMOGLOBIN A1C
Est. average glucose Bld gHb Est-mCnc: 123 mg/dL
Hgb A1c MFr Bld: 5.9 % — ABNORMAL HIGH (ref 4.8–5.6)

## 2023-06-17 LAB — INSULIN, RANDOM: INSULIN: 14.6 u[IU]/mL (ref 2.6–24.9)

## 2023-06-25 ENCOUNTER — Other Ambulatory Visit (INDEPENDENT_AMBULATORY_CARE_PROVIDER_SITE_OTHER): Payer: Self-pay | Admitting: Physician Assistant

## 2023-06-25 DIAGNOSIS — E559 Vitamin D deficiency, unspecified: Secondary | ICD-10-CM

## 2023-07-01 ENCOUNTER — Ambulatory Visit (INDEPENDENT_AMBULATORY_CARE_PROVIDER_SITE_OTHER): Admitting: Adult Health

## 2023-07-01 ENCOUNTER — Encounter (INDEPENDENT_AMBULATORY_CARE_PROVIDER_SITE_OTHER): Payer: Self-pay | Admitting: Adult Health

## 2023-07-01 VITALS — BP 132/76 | HR 78 | Temp 97.8°F | Ht 65.0 in | Wt 278.0 lb

## 2023-07-01 DIAGNOSIS — E7800 Pure hypercholesterolemia, unspecified: Secondary | ICD-10-CM | POA: Diagnosis not present

## 2023-07-01 DIAGNOSIS — E538 Deficiency of other specified B group vitamins: Secondary | ICD-10-CM | POA: Diagnosis not present

## 2023-07-01 DIAGNOSIS — E559 Vitamin D deficiency, unspecified: Secondary | ICD-10-CM | POA: Diagnosis not present

## 2023-07-01 DIAGNOSIS — R7303 Prediabetes: Secondary | ICD-10-CM

## 2023-07-01 DIAGNOSIS — E669 Obesity, unspecified: Secondary | ICD-10-CM

## 2023-07-01 DIAGNOSIS — Z6841 Body Mass Index (BMI) 40.0 and over, adult: Secondary | ICD-10-CM

## 2023-07-01 DIAGNOSIS — E66813 Obesity, class 3: Secondary | ICD-10-CM

## 2023-07-01 NOTE — Progress Notes (Unsigned)
 WEIGHT SUMMARY AND BIOMETRICS  Vitals Temp: 97.8 F (36.6 C) BP: 132/76 Pulse Rate: 78 SpO2: 98 %   Anthropometric Measurements Height: 5\' 5"  (1.651 m) Weight: 278 lb (126.1 kg) BMI (Calculated): 46.26 Weight at Last Visit: 279 lb Weight Lost Since Last Visit: 1 lb Weight Gained Since Last Visit: 0 Starting Weight: 291 lb Total Weight Loss (lbs): 13 lb (5.897 kg) Peak Weight: 294 lb   Body Composition  Body Fat %: 54.5 % Fat Mass (lbs): 152 lbs Muscle Mass (lbs): 120.4 lbs Visceral Fat Rating : 20   Other Clinical Data Fasting: no Labs: no Today's Visit #: 17 Starting Date: 02/20/21    Chief Complaint:   OBESITY Joann Mendez is here to discuss her progress with her obesity treatment plan.  She is on the the Category 2 Plan and states she is following her eating plan approximately 50-60 % of the time.  She states she is exercising Walking/Running  120 minutes 4-5 times per week.  Interim History:  She officially retired from the [employer] 06/26/2023- Thank You for your Service! She lives with her 52 year old husband and has two adult daughters, age 48 and 61 Per pt, her youngest daughter will likely ask more of her mother now that she "has more free time".  Joann Mendez will likely travel to [place] for her parents, re: Her father had an AICD placed- faily tr Retirement Goals: 1) Ride her horse (after more weight loss) 2) Purchase a new horse  Subjective:   1. B12 deficiency due to diet Discussed Labs Vitamin B12 232 - 1,245 pg/mL 424   B12 stable  2. Prediabetes Discussed Labs  Latest Reference Range & Units 06/16/23 12:12  Glucose 70 - 99 mg/dL 161 (H)  Hemoglobin W9U 4.8 - 5.6 % 5.9 (H)  Est. average glucose Bld gHb Est-mCnc mg/dL 045  INSULIN  2.6 - 24.9 uIU/mL 14.6  (H): Data is abnormally high  CBG, A1c, and Insulin  levels all above goal She has been inconsistent with daily Metformin  500mg  1/2 tab- she estimates to take 3-4 times per week  3.  Vitamin D  deficiency Discussed Labs  Latest Reference Range & Units 06/16/23 12:12  Vitamin D , 25-Hydroxy 30.0 - 100.0 ng/mL 27.5 (L)      (L): Data is abnormally low  Vit D Level well below goal of 50-70 She is on bi-weekly Ergocalciferol - denies N/V/Muscle Weakness  4. Pure hypercholesterolemia Discussed Labs 06/16/2023 CMP: Liver enzymes are normal She is not on statin therapy at present  Lipid Panel     Component Value Date/Time   CHOL 174 02/20/2021 0951   TRIG 82 02/20/2021 0951   HDL 59 02/20/2021 0951   LDLCALC 100 (H) 02/20/2021 0951   LABVLDL 15 02/20/2021 0951   The 10-year ASCVD risk score (Arnett DK, et al., 2019) is: 3.4%   ASCVD risk score is acceptable  Assessment/Plan:   1. B12 deficiency due to diet Continue oral B12 supplementation  2. Prediabetes (Primary) Take Metformin  500mg  1/2 tab daily at breakfast- does not require refill today  3. Vitamin D  deficiency Continue bi-weekly Ergocalciferol - denies need for refill today  4. Pure hypercholesterolemia Cat 2 MP and remain active, ie: running an operational farm  5. BMI 45.0-49.9, adult (HCC) Current BMI 46.4  Joann Mendez is currently in the action stage of change. As such, her goal is to continue with weight loss efforts. She has agreed to the Category 2 Plan.   Exercise goals: All adults should  avoid inactivity. Some physical activity is better than none, and adults who participate in any amount of physical activity gain some health benefits. Adults should also include muscle-strengthening activities that involve all major muscle groups on 2 or more days a week. Operating a [employer] modification strategies: increasing lean protein intake, decreasing simple carbohydrates, increasing vegetables, increasing water intake, no skipping meals, meal planning and cooking strategies, keeping healthy foods in the home, and planning for success.  Joann Mendez has agreed to follow-up with our clinic in 4 weeks. She was  informed of the importance of frequent follow-up visits to maximize her success with intensive lifestyle modifications for her multiple health conditions.   Objective:   Blood pressure 132/76, pulse 78, temperature 97.8 F (36.6 C), height 5\' 5"  (1.651 m), weight 278 lb (126.1 kg), SpO2 98%. Body mass index is 46.26 kg/m.  General: Cooperative, alert, well developed, in no acute distress. HEENT: Conjunctivae and lids unremarkable. Cardiovascular: Regular rhythm.  Lungs: Normal work of breathing. Neurologic: No focal deficits.   Lab Results  Component Value Date   CREATININE 0.90 06/16/2023   BUN 11 06/16/2023   NA 145 (H) 06/16/2023   K 4.2 06/16/2023   CL 106 06/16/2023   CO2 23 06/16/2023   Lab Results  Component Value Date   ALT 12 06/16/2023   AST 15 06/16/2023   ALKPHOS 73 06/16/2023   BILITOT 0.5 06/16/2023   Lab Results  Component Value Date   HGBA1C 5.9 (H) 06/16/2023   HGBA1C 5.9 (H) 09/23/2021   HGBA1C 6.2 (H) 02/20/2021   HGBA1C 5.8 (H) 06/06/2019   Lab Results  Component Value Date   INSULIN  14.6 06/16/2023   INSULIN  13.8 09/23/2021   INSULIN  18.1 02/20/2021   Lab Results  Component Value Date   TSH 2.240 02/20/2021   Lab Results  Component Value Date   CHOL 174 02/20/2021   HDL 59 02/20/2021   LDLCALC 100 (H) 02/20/2021   TRIG 82 02/20/2021   Lab Results  Component Value Date   VD25OH 27.5 (L) 06/16/2023   VD25OH 35.7 09/23/2021   VD25OH 23.3 (L) 02/20/2021   Lab Results  Component Value Date   WBC 10.5 02/20/2021   HGB 13.6 02/20/2021   HCT 39.6 02/20/2021   MCV 88 02/20/2021   PLT 245 02/20/2021   No results found for: "IRON", "TIBC", "FERRITIN"  Attestation Statements:   Reviewed by clinician on day of visit: allergies, medications, problem list, medical history, surgical history, family history, social history, and previous encounter notes.  I have reviewed the above documentation for accuracy and completeness, and I agree  with the above. -  Kemara Quigley d. Athleen Feltner, NP-C

## 2023-07-12 ENCOUNTER — Other Ambulatory Visit (INDEPENDENT_AMBULATORY_CARE_PROVIDER_SITE_OTHER): Payer: Self-pay | Admitting: Adult Health

## 2023-08-05 ENCOUNTER — Ambulatory Visit (INDEPENDENT_AMBULATORY_CARE_PROVIDER_SITE_OTHER): Admitting: Physician Assistant

## 2023-08-10 DIAGNOSIS — H35352 Cystoid macular degeneration, left eye: Secondary | ICD-10-CM | POA: Diagnosis not present

## 2023-08-10 DIAGNOSIS — H35371 Puckering of macula, right eye: Secondary | ICD-10-CM | POA: Diagnosis not present

## 2023-08-10 DIAGNOSIS — H33003 Unspecified retinal detachment with retinal break, bilateral: Secondary | ICD-10-CM | POA: Diagnosis not present

## 2023-08-10 DIAGNOSIS — H26492 Other secondary cataract, left eye: Secondary | ICD-10-CM | POA: Diagnosis not present

## 2023-08-14 ENCOUNTER — Ambulatory Visit (INDEPENDENT_AMBULATORY_CARE_PROVIDER_SITE_OTHER): Admitting: Family Medicine

## 2023-08-14 ENCOUNTER — Encounter: Payer: Self-pay | Admitting: Family Medicine

## 2023-08-14 VITALS — BP 140/84 | HR 64 | Ht 67.5 in | Wt 286.0 lb

## 2023-08-14 DIAGNOSIS — B379 Candidiasis, unspecified: Secondary | ICD-10-CM

## 2023-08-14 DIAGNOSIS — Z86718 Personal history of other venous thrombosis and embolism: Secondary | ICD-10-CM

## 2023-08-14 DIAGNOSIS — Z780 Asymptomatic menopausal state: Secondary | ICD-10-CM

## 2023-08-14 DIAGNOSIS — Z01419 Encounter for gynecological examination (general) (routine) without abnormal findings: Secondary | ICD-10-CM | POA: Diagnosis not present

## 2023-08-14 DIAGNOSIS — Z1331 Encounter for screening for depression: Secondary | ICD-10-CM

## 2023-08-14 MED ORDER — GNP STOCKINGS MISC: 2.0000 [IU] | Freq: Every day | 1 refills | Status: AC

## 2023-08-14 MED ORDER — NYSTATIN 100000 UNIT/GM EX CREA
1.0000 | TOPICAL_CREAM | Freq: Two times a day (BID) | CUTANEOUS | 6 refills | Status: DC
Start: 2023-08-14 — End: 2023-08-17

## 2023-08-14 NOTE — Progress Notes (Signed)
 Patient presents for Annual.  LMP: No LMP recorded. Patient has had a hysterectomy.  Last pap: Date: 07/22/21 Contraception: Post-menopausal Mammogram: Due, last mammogram: 09/2022-[place] [facility])  STD Screening: not indicated Flu Vaccine : N/A  CC: Annual Wants to discuss sweat and redness and soreness under breast   Worrying a lot, not sleeping a lot   Had to retire early, mom and dad having procedures in [place]     Wants Rx for TED hose  (Thigh high open toe 20-30s)

## 2023-08-17 MED ORDER — NYSTATIN 100000 UNIT/GM EX CREA: 1.0000 | TOPICAL_CREAM | Freq: Two times a day (BID) | CUTANEOUS | 6 refills | Status: AC

## 2023-08-17 NOTE — Progress Notes (Unsigned)
   GYNECOLOGY ANNUAL PREVENTATIVE CARE ENCOUNTER NOTE  Subjective:  Joann Mendez is a 63 y.o. [id number] female here for a routine annual gynecologic exam.  Current complaints:  menopausal sx.     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 She is commuting there regularly and stays for a couple weeks at a time. She reports this has effected her diet.  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 - She is working with healthy weight and wellness.   Denies abnormal vaginal bleeding, discharge, pelvic pain, problems with intercourse or other gynecologic concerns.    Gynecologic History No LMP recorded. Patient has had a hysterectomy. Contraception: status post hysterectomy Last Pap: 2023. Results were: abnormal Last mammogram: 2024. Results were: normal  Health Maintenance Due  Topic Date Due   Zoster Vaccines- Shingrix (1 of 2) Never done   COVID-19 Vaccine (3 - Pfizer risk series) 06/10/2019    The following portions of the patient's history were reviewed and updated as appropriate: allergies, current medications, past family history, past medical history, past social history, past surgical history and problem list.  Review of Systems Pertinent items are noted in HPI.   Objective:  BP (!) 140/84   Pulse 64   Ht 5' 7.5 (1.715 m)   Wt 286 lb (129.7 kg)   BMI 44.13 kg/m  CONSTITUTIONAL: Well-developed, well-nourished female in no acute distress.  HENT:  Normocephalic, atraumatic, External right and left ear normal. Oropharynx is clear and moist EYES:  No scleral icterus.  NECK: Normal range of motion, supple, no masses.  Normal thyroid .  SKIN: Skin is warm and dry. No rash  noted. Not diaphoretic. No erythema. No pallor. + rash under bilateral breasts NEUROLOGIC: Alert and oriented to person, place, and time. Normal reflexes, muscle tone coordination. No cranial nerve deficit noted. PSYCHIATRIC: Normal mood and affect. Normal behavior. Normal judgment and thought content. CARDIOVASCULAR: Normal heart rate noted, regular rhythm. 2+ distal pulses. RESPIRATORY: Effort and breath sounds normal, no problems with respiration noted. BREASTS: Symmetric in size. No masses, skin changes, nipple drainage, or lymphadenopathy. ABDOMEN: Soft,  no distention noted.  No tenderness, rebound or guarding.  PELVIC: deferred MUSCULOSKELETAL: Normal range of motion.     Assessment and Plan:  1) Annual gynecologic examination:    Routine preventative health maintenance measures emphasized. Reviewed perimenopausal symptoms and management.    1. History of DVT (deep vein thrombosis) (Primary) - Elastic Bandages & Supports (GNP STOCKINGS) MISC; 2 Units by Does not apply route daily. Needs compression stalkings to the thigh Strength 20-30 Size likely L or XL Pleas sure to fit appropriately  Dispense: 2 each; Refill: 1  2. Well woman exam with routine gynecological exam - MM 3D SCREENING MAMMOGRAM BILATERAL BREAST; Future  3. Candida infection (00785) Reviewed prevention and use of barrier creams - nystatin  cream (MYCOSTATIN ); Apply 1 Application topically 2 (two) times daily.  Dispense: 30 g; Refill: 6  Please refer to After Visit Summary for other counseling recommendations.   No follow-ups on file.  Suzen Maryan Masters, MD, MPH, ABFM Attending Physician Center for [facility]

## 2023-08-31 ENCOUNTER — Ambulatory Visit (INDEPENDENT_AMBULATORY_CARE_PROVIDER_SITE_OTHER): Admitting: Adult Health

## 2023-10-06 ENCOUNTER — Ambulatory Visit: Admitting: Family Medicine

## 2023-10-06 ENCOUNTER — Encounter: Payer: Self-pay | Admitting: Family Medicine

## 2023-10-06 VITALS — BP 136/80 | HR 73 | Temp 99.5°F | Ht 67.5 in | Wt 284.1 lb

## 2023-10-06 DIAGNOSIS — J019 Acute sinusitis, unspecified: Secondary | ICD-10-CM

## 2023-10-06 MED ORDER — GUAIFENESIN-CODEINE 100-10 MG/5ML PO SOLN: 5.0000 mL | Freq: Three times a day (TID) | ORAL | 0 refills | Status: DC | PRN

## 2023-10-06 MED ORDER — BENZONATATE 100 MG PO CAPS: 100.0000 mg | ORAL_CAPSULE | Freq: Two times a day (BID) | ORAL | 0 refills | Status: DC | PRN

## 2023-10-06 MED ORDER — DOXYCYCLINE HYCLATE 100 MG PO TABS: 100.0000 mg | ORAL_TABLET | Freq: Two times a day (BID) | ORAL | 0 refills | Status: DC

## 2023-10-06 NOTE — Progress Notes (Signed)
 Ph: [phone] Fax: [phone]   Patient ID: Joann Mendez, female    DOB: 07/30/1960, 63 y.o.   MRN: [id number]  This visit was conducted in person.  BP 136/80   Pulse 73   Temp 99.5 F (37.5 C) (Oral)   Ht 5' 7.5 (1.715 m)   Wt 284 lb 2 oz (128.9 kg)   SpO2 99%   BMI 43.84 kg/m    CC: sinusitis  Subjective:   HPI: Joann Mendez is a 62 y.o. female presenting on 10/06/2023 for Sinus Problem (C/o sinus / coughing. Started 3 weeks ago. She has took OTC meds. Pt states nothing is working.)   3 wks of fatigue, malaise, nasal congestion, with productive coughing from PNdrainage, sinus pain/pressure, blowing nose with significant colored mucous production. Having soreness to chest and abdomen from all the coughing, with coughing fits.   Has taken 2 packs of tylenol sinus, vicks sinus, 2 bottles of delsym, codeine  cough syrup.   No fevers/chills, ST, ear or tooth pain, nausea.  Husband sick at home as well.  No h/o asthma Non smoker     Relevant past medical, surgical, family and social history reviewed and updated as indicated. Interim medical history since our last visit reviewed. Allergies and medications reviewed and updated. Outpatient Medications Prior to Visit  Medication Sig Dispense Refill   aspirin EC 81 MG tablet Take by mouth.     cyanocobalamin  (VITAMIN B12) 1000 MCG tablet Take 1 tablet (1,000 mcg total) by mouth daily.     Elastic Bandages & Supports (GNP STOCKINGS) MISC 2 Units by Does not apply route daily. Needs compression stalkings to the thigh Strength 20-30 Size likely L or XL Pleas sure to fit appropriately 2 each 1   metFORMIN  (GLUCOPHAGE ) 500 MG tablet 1/2 tab daily with meal 30 tablet 0   nystatin  cream (MYCOSTATIN ) Apply 1 Application topically 2 (two) times daily. 30 g 6   Vitamin D , Ergocalciferol , (DRISDOL ) 1.25 MG (50000 UNIT) CAPS capsule 1 po q Thursday and 1 po q Sun 8 capsule 0   No facility-administered medications prior to visit.      Per HPI unless specifically indicated in ROS section below Review of Systems  Objective:  BP 136/80   Pulse 73   Temp 99.5 F (37.5 C) (Oral)   Ht 5' 7.5 (1.715 m)   Wt 284 lb 2 oz (128.9 kg)   SpO2 99%   BMI 43.84 kg/m   Wt Readings from Last 3 Encounters:  10/06/23 284 lb 2 oz (128.9 kg)  08/14/23 286 lb (129.7 kg)  07/01/23 278 lb (126.1 kg)      Physical Exam Vitals and nursing note reviewed.  Constitutional:      Appearance: Normal appearance. She is not ill-appearing.  HENT:     Head: Normocephalic and atraumatic.     Right Ear: Tympanic membrane, ear canal and external ear normal. There is no impacted cerumen.     Left Ear: Tympanic membrane, ear canal and external ear normal. There is no impacted cerumen.     Nose: Mucosal edema (mild) and congestion present. No rhinorrhea.     Right Turbinates: Not enlarged, swollen or pale.     Left Turbinates: Not enlarged, swollen or pale.     Right Sinus: No maxillary sinus tenderness or frontal sinus tenderness.     Left Sinus: No maxillary sinus tenderness or frontal sinus tenderness.     Mouth/Throat:     Mouth: Mucous membranes  are moist.     Pharynx: Oropharynx is clear. No oropharyngeal exudate or posterior oropharyngeal erythema.     Comments: Wearing mask Eyes:     Extraocular Movements: Extraocular movements intact.     Conjunctiva/sclera: Conjunctivae normal.     Pupils: Pupils are equal, round, and reactive to light.  Cardiovascular:     Rate and Rhythm: Normal rate and regular rhythm.     Pulses: Normal pulses.     Heart sounds: Normal heart sounds. No murmur heard. Pulmonary:     Effort: Pulmonary effort is normal. No respiratory distress.     Breath sounds: Normal breath sounds. No wheezing, rhonchi or rales.  Lymphadenopathy:     Head:     Right side of head: No submental, submandibular, tonsillar, preauricular or posterior auricular adenopathy.     Left side of head: No submental, submandibular,  tonsillar, preauricular or posterior auricular adenopathy.     Cervical: No cervical adenopathy.     Right cervical: No superficial cervical adenopathy.    Left cervical: No superficial cervical adenopathy.     Upper Body:     Right upper body: No supraclavicular adenopathy.     Left upper body: No supraclavicular adenopathy.  Skin:    Findings: No rash.  Neurological:     Mental Status: She is alert.  Psychiatric:        Mood and Affect: Mood normal.        Behavior: Behavior normal.        Assessment & Plan:   Problem List Items Addressed This Visit     Acute sinusitis - Primary   Treat for acute bacterial sinusitis given duration and progression of symptoms. Doxycycline  10d course. Tessalon  for cough Codeine  cough syrup with sedation precautions - previously tolerated well Update if not improving with treatment.  Lungs clear on exam. Pt agrees with plan.       Relevant Medications   doxycycline  (VIBRA -TABS) 100 MG tablet   benzonatate  (TESSALON ) 100 MG capsule   guaiFENesin -codeine  100-10 MG/5ML syrup     Meds ordered this encounter  Medications   doxycycline  (VIBRA -TABS) 100 MG tablet    Sig: Take 1 tablet (100 mg total) by mouth 2 (two) times daily.    Dispense:  20 tablet    Refill:  0   benzonatate  (TESSALON ) 100 MG capsule    Sig: Take 1 capsule (100 mg total) by mouth 2 (two) times daily as needed for cough.    Dispense:  30 capsule    Refill:  0   guaiFENesin -codeine  100-10 MG/5ML syrup    Sig: Take 5 mLs by mouth 3 (three) times daily as needed for cough (sedation precautions).    Dispense:  120 mL    Refill:  0    No orders of the defined types were placed in this encounter.   Patient Instructions  You have a sinus infection. Take medicine as prescribed: doxycycline  10 day course Push fluids and plenty of rest. May use tessalon  perls for cough as needed, codeine  cough syrup refilled as well.  Nasal saline irrigation or neti pot to help drain  sinuses. May use plain mucinex  with plenty of fluid to help mobilize mucous. Please let us  know if fever >101.5, trouble opening/closing mouth, difficulty swallowing, or worsening instead of improving as expected.   Follow up plan: No follow-ups on file.  Anton Blas, MD

## 2023-10-06 NOTE — Assessment & Plan Note (Addendum)
 Treat for acute bacterial sinusitis given duration and progression of symptoms. Doxycycline  10d course. Tessalon  for cough Codeine  cough syrup with sedation precautions - previously tolerated well Update if not improving with treatment.  Lungs clear on exam. Pt agrees with plan.

## 2023-10-06 NOTE — Patient Instructions (Signed)
 You have a sinus infection. Take medicine as prescribed: doxycycline  10 day course Push fluids and plenty of rest. May use tessalon  perls for cough as needed, codeine  cough syrup refilled as well.  Nasal saline irrigation or neti pot to help drain sinuses. May use plain mucinex  with plenty of fluid to help mobilize mucous. Please let us  know if fever >101.5, trouble opening/closing mouth, difficulty swallowing, or worsening instead of improving as expected.

## 2023-10-09 ENCOUNTER — Ambulatory Visit: Payer: Self-pay

## 2023-10-09 ENCOUNTER — Other Ambulatory Visit: Payer: Self-pay | Admitting: Family Medicine

## 2023-10-09 MED ORDER — CEFDINIR 300 MG PO CAPS: 300.0000 mg | ORAL_CAPSULE | Freq: Two times a day (BID) | ORAL | Status: DC

## 2023-10-09 MED ORDER — CEFDINIR 300 MG PO CAPS: 300.0000 mg | ORAL_CAPSULE | Freq: Two times a day (BID) | ORAL | 0 refills | Status: AC

## 2023-10-09 NOTE — Telephone Encounter (Signed)
 If slowly getting better, would continue as is and give more time for doxy to work.    If she is worse, then I think she needs to be rechecked and would add on cefdinir  in the meantime.  Sent rx.    Thanks.

## 2023-10-09 NOTE — Telephone Encounter (Signed)
  FYI Only or Action Required?: Action required by provider: states not feeling better after taking antibiotic; was told to call back in. .  Patient was last seen in primary care on 10/06/2023 by Rilla Baller, MD.  Called Nurse Triage reporting Cough.  Symptoms began several weeks ago.  Interventions attempted: Nothing.  Symptoms are: unchanged.  Triage Disposition: Home Care  Patient/caregiver understands and will follow disposition?: No, wishes to speak with PCP   Copied from CRM #[id number]. Topic: Clinical - Red Word Triage >> Oct 09, 2023 11:13 AM Terri MATSU wrote: [facility] that prompted transfer to Nurse Triage: Patient stated she's been coughing up discolored phlegm (green) for 3weeks Reason for Disposition  Cough  Answer Assessment - Initial Assessment Questions 1. ONSET: When did the cough begin?      3 weeks ago started with runny nose and productive cough 2. SEVERITY: How bad is the cough today?      Was seen on Tuesday and given rx for cough relief and doxycycline .  States still not feeling better 3. SPUTUM: Describe the color of your sputum (e.g., none, dry cough; clear, white, yellow, green)     green 4. HEMOPTYSIS: Are you coughing up any blood? If Yes, ask: How much? (e.g., flecks, streaks, tablespoons, etc.)     denies 5. DIFFICULTY BREATHING: Are you having difficulty breathing? If Yes, ask: How bad is it? (e.g., mild, moderate, severe)      no 6. FEVER: Do you have a fever? If Yes, ask: What is your temperature, how was it measured, and when did it start?     denies 7. CARDIAC HISTORY: Do you have any history of heart disease? (e.g., heart attack, congestive heart failure)      denies 8. LUNG HISTORY: Do you have any history of lung disease?  (e.g., pulmonary embolus, asthma, emphysema)     deine 9. PE RISK FACTORS: Do you have a history of blood clots? (or: recent major surgery, recent prolonged travel, bedridden)     denies 10.  OTHER SYMPTOMS: Do you have any other symptoms? (e.g., runny nose, wheezing, chest pain)       Runny nose,  11. PREGNANCY: Is there any chance you are pregnant? When was your last menstrual period?       na 12. TRAVEL: Have you traveled out of the country in the last month? (e.g., travel history, exposures)       na  Protocols used: Cough - Acute Productive-A-AH

## 2023-10-09 NOTE — Telephone Encounter (Signed)
 Reached out to patient and advised of Dr. Elfredia instructions. I did make her an appointment to be seen on 10/13/23 if she is not feeling better over the weekend. She will call and advise either way

## 2023-10-13 ENCOUNTER — Ambulatory Visit: Admitting: Family Medicine

## 2023-10-13 VITALS — BP 128/80 | HR 64 | Temp 98.4°F | Wt 283.0 lb

## 2023-10-13 DIAGNOSIS — J0100 Acute maxillary sinusitis, unspecified: Secondary | ICD-10-CM | POA: Diagnosis not present

## 2023-10-13 MED ORDER — BENZONATATE 200 MG PO CAPS: 200.0000 mg | ORAL_CAPSULE | Freq: Two times a day (BID) | ORAL | 1 refills | Status: AC | PRN

## 2023-10-13 MED ORDER — GUAIFENESIN-CODEINE 100-10 MG/5ML PO SOLN: 5.0000 mL | Freq: Three times a day (TID) | ORAL | 0 refills | Status: AC | PRN

## 2023-10-13 NOTE — Progress Notes (Unsigned)
 Pt has been dealing with cough and nasal congestion for about 4 weeks now. Pt states she was seen on 9/9 and was diagnosed with sinusitis and the doxy  prescribed is  not helping. Post nasal gtt.  No fevers, no ST.  No aches.  Still with cough and congestion. Talking causes more cough.    Tessalon  helped with cough.  guaiFENesin -codeine  helped with cough.   She isn't on cefdinir  yet.  Took last dose of doxy yesterday.   D/w pt about using nasal saline.   Meds, vitals, and allergies reviewed.   ROS: Per HPI unless specifically indicated in ROS section   GEN: nad, alert and oriented HEENT: mucous membranes moist, tm w/o erythema, OP without erythema NECK: supple w/o LA CV: rrr.   PULM: ctab, no inc wob EXT: no edema SKIN: Well-perfused.

## 2023-10-13 NOTE — Patient Instructions (Addendum)
 Change to cefdinir , stop doxy.  Use tessalon  and the cough syrup if needed.  Update me as needed.  Take care.  Glad to see you.

## 2023-10-14 DIAGNOSIS — J0100 Acute maxillary sinusitis, unspecified: Secondary | ICD-10-CM | POA: Insufficient documentation

## 2023-10-14 NOTE — Assessment & Plan Note (Signed)
 Presumed.  Nontoxic.  Okay for outpatient follow-up. Change to cefdinir , stop doxy.  Rationale for cephalosporin use discussed with patient. Use tessalon  and the cough syrup if needed.  Update me as needed.

## 2023-10-15 ENCOUNTER — Telehealth: Payer: Self-pay

## 2023-10-15 NOTE — Telephone Encounter (Signed)
 I hope she feels better soon.  She hasn't had time for cefdinir  to have effect.  I would continue the abx.   Is she acutely worse? What symptoms are most bothersome now?

## 2023-10-15 NOTE — Telephone Encounter (Signed)
 Copied from CRM [id number]. Topic: Clinical - Medical Advice >> Oct 15, 2023 10:19 AM Anairis L wrote: Reason for CRM: Patient is calling in because she is still not feeling well. This is her 4th week feeling ill. She wants to know if she needs more medication.   Please call patient with advise.

## 2023-10-16 NOTE — Telephone Encounter (Signed)
 Noted. Thanks.

## 2023-10-16 NOTE — Telephone Encounter (Signed)
 Spoke with patient and she states that today she is feeling a little better. She is still coughing. But generally she is doing a lot better. She will monitor it over the weekend and call back on Monday if she has taken a step back.

## 2023-10-30 DIAGNOSIS — Z1231 Encounter for screening mammogram for malignant neoplasm of breast: Secondary | ICD-10-CM | POA: Diagnosis not present

## 2023-11-10 DIAGNOSIS — H219 Unspecified disorder of iris and ciliary body: Secondary | ICD-10-CM | POA: Diagnosis not present
